# Patient Record
Sex: Female | Born: 1975 | Hispanic: Yes | Marital: Single | Smoking: Former smoker
Health system: Southern US, Community
[De-identification: ages and names within clinical notes are randomized; demographics above are authoritative.]

## PROBLEM LIST (undated history)

## (undated) DIAGNOSIS — J45909 Unspecified asthma, uncomplicated: Secondary | ICD-10-CM

## (undated) DIAGNOSIS — M549 Dorsalgia, unspecified: Secondary | ICD-10-CM

## (undated) HISTORY — PX: CHOLECYSTECTOMY: SHX55

---

## 2005-01-07 ENCOUNTER — Emergency Department (HOSPITAL_COMMUNITY): Admission: EM | Admit: 2005-01-07 | Discharge: 2005-01-07 | Payer: Self-pay | Admitting: Emergency Medicine

## 2005-10-23 ENCOUNTER — Emergency Department (HOSPITAL_COMMUNITY): Admission: EM | Admit: 2005-10-23 | Discharge: 2005-10-23 | Payer: Self-pay | Admitting: Emergency Medicine

## 2006-11-04 ENCOUNTER — Emergency Department (HOSPITAL_COMMUNITY): Admission: EM | Admit: 2006-11-04 | Discharge: 2006-11-04 | Payer: Self-pay | Admitting: Emergency Medicine

## 2007-08-14 ENCOUNTER — Ambulatory Visit (HOSPITAL_COMMUNITY): Admission: RE | Admit: 2007-08-14 | Discharge: 2007-08-14 | Payer: Self-pay | Admitting: Obstetrics

## 2015-08-13 ENCOUNTER — Emergency Department (HOSPITAL_COMMUNITY)
Admission: EM | Admit: 2015-08-13 | Discharge: 2015-08-13 | Disposition: A | Discharge status: Home / Self Care | Payer: Self-pay | Attending: Emergency Medicine | Admitting: Emergency Medicine

## 2015-08-13 ENCOUNTER — Encounter (HOSPITAL_COMMUNITY): Payer: Self-pay | Admitting: Emergency Medicine

## 2015-08-13 DIAGNOSIS — G8929 Other chronic pain: Secondary | ICD-10-CM | POA: Insufficient documentation

## 2015-08-13 DIAGNOSIS — M5442 Lumbago with sciatica, left side: Secondary | ICD-10-CM | POA: Insufficient documentation

## 2015-08-13 HISTORY — DX: Dorsalgia, unspecified: M54.9

## 2015-08-13 MED ORDER — HYDROCODONE-ACETAMINOPHEN 5-325 MG PO TABS
2.0000 | ORAL_TABLET | Freq: Once | ORAL | Status: AC
  Administered 2015-08-13: 2 via ORAL
  Filled 2015-08-13: qty 2

## 2015-08-13 NOTE — Discharge Instructions (Signed)
Chronic Back Pain  When back pain lasts longer than 3 months, it is called chronic back pain.People with chronic back pain often go through certain periods that are more intense (flare-ups).  CAUSES Chronic back pain can be caused by wear and tear (degeneration) on different structures in your back. These structures include:  The bones of your spine (vertebrae) and the joints surrounding your spinal cord and nerve roots (facets).  The strong, fibrous tissues that connect your vertebrae (ligaments). Degeneration of these structures may result in pressure on your nerves. This can lead to constant pain. HOME CARE INSTRUCTIONS  Avoid bending, heavy lifting, prolonged sitting, and activities which make the problem worse.  Take brief periods of rest throughout the day to reduce your pain. Lying down or standing usually is better than sitting while you are resting.  Take over-the-counter or prescription medicines only as directed by your caregiver. SEEK IMMEDIATE MEDICAL CARE IF:   You have weakness or numbness in one of your legs or feet.  You have trouble controlling your bladder or bowels.  You have nausea, vomiting, abdominal pain, shortness of breath, or fainting. Document Released: 12/14/2004 Document Revised: 01/29/2012 Document Reviewed: 10/21/2011 [facility] Patient Information 2015 [place], [place]. This information is not intended to replace advice given to you by your health care provider. Make sure you discuss any questions you have with your health care provider.  Back Pain, Adult Back pain is very common. The pain often gets better over time. The cause of back pain is usually not dangerous. Most people can learn to manage their back pain on their own.  HOME CARE   Stay active. Start with short walks on flat ground if you can. Try to walk farther each day.  Do not sit, drive, or stand in one place for more than 30 minutes. Do not stay in bed.  Do not avoid exercise or work.  Activity can help your back heal faster.  Be careful when you bend or lift an object. Bend at your knees, keep the object close to you, and do not twist.  Sleep on a firm mattress. Lie on your side, and bend your knees. If you lie on your back, put a pillow under your knees.  Only take medicines as told by your doctor.  Put ice on the injured area.  Put ice in a plastic bag.  Place a towel between your skin and the bag.  Leave the ice on for 15-20 minutes, 03-04 times a day for the first 2 to 3 days. After that, you can switch between ice and heat packs.  Ask your doctor about back exercises or massage.  Avoid feeling anxious or stressed. Find good ways to deal with stress, such as exercise. GET HELP RIGHT AWAY IF:   Your pain does not go away with rest or medicine.  Your pain does not go away in 1 week.  You have new problems.  You do not feel well.  The pain spreads into your legs.  You cannot control when you poop (bowel movement) or pee (urinate).  Your arms or legs feel weak or lose feeling (numbness).  You feel sick to your stomach (nauseous) or throw up (vomit).  You have belly (abdominal) pain.  You feel like you may pass out (faint). MAKE SURE YOU:   Understand these instructions.  Will watch your condition.  Will get help right away if you are not doing well or get worse. Document Released: 04/24/2008 Document Revised: 01/29/2012 Document  Reviewed: 03/10/2014 ExitCare Patient Information 2015 [place], [place]. This information is not intended to replace advice given to you by your health care provider. Make sure you discuss any questions you have with your health care provider.  Back Exercises Back exercises help treat and prevent back injuries. The goal of back exercises is to increase the strength of your abdominal and back muscles and the flexibility of your back. These exercises should be started when you no longer have back pain. Back exercises  include:  Pelvic Tilt. Lie on your back with your knees bent. Tilt your pelvis until the lower part of your back is against the floor. Hold this position 5 to 10 sec and repeat 5 to 10 times.  Knee to Chest. Pull first 1 knee up against your chest and hold for 20 to 30 seconds, repeat this with the other knee, and then both knees. This may be done with the other leg straight or bent, whichever feels better.  Sit-Ups or Curl-Ups. Bend your knees 90 degrees. Start with tilting your pelvis, and do a partial, slow sit-up, lifting your trunk only 30 to 45 degrees off the floor. Take at least 2 to 3 seconds for each sit-up. Do not do sit-ups with your knees out straight. If partial sit-ups are difficult, simply do the above but with only tightening your abdominal muscles and holding it as directed.  Hip-Lift. Lie on your back with your knees flexed 90 degrees. Push down with your feet and shoulders as you raise your hips a couple inches off the floor; hold for 10 seconds, repeat 5 to 10 times.  Back arches. Lie on your stomach, propping yourself up on bent elbows. Slowly press on your hands, causing an arch in your low back. Repeat 3 to 5 times. Any initial stiffness and discomfort should lessen with repetition over time.  Shoulder-Lifts. Lie face down with arms beside your body. Keep hips and torso pressed to floor as you slowly lift your head and shoulders off the floor. Do not overdo your exercises, especially in the beginning. Exercises may cause you some mild back discomfort which lasts for a few minutes; however, if the pain is more severe, or lasts for more than 15 minutes, do not continue exercises until you see your caregiver. Improvement with exercise therapy for back problems is slow.  See your caregivers for assistance with developing a proper back exercise program. Document Released: 12/14/2004 Document Revised: 01/29/2012 Document Reviewed: 09/07/2011 [facility] Patient Information 2015  [place], [place]. This information is not intended to replace advice given to you by your health care provider. Make sure you discuss any questions you have with your health care provider.

## 2015-08-13 NOTE — ED Provider Notes (Signed)
CSN: [id number]     Arrival date & time 08/13/15  8295 History   First MD Initiated Contact with Patient 08/13/15 [phone]     Chief Complaint  Patient presents with  . Back Pain   HPI  Ms. Bachtell is a 39 year old female with PMHx of back pain presenting with acute worsening of her chronic pain. Pt states she was diagnosed with a "pinched nerve" 7 years ago. She states that she has had multiple exacerbations of her back pain over the past few years that are usually well controlled with tylenol or aspirin. Pt states that her current episode started 3 days ago and is the same presentation of back pain as previous episodes but is more severe. Pain is in her lumbar back and radiates down into the left buttock and posterior left thigh and stops at the knee. The pain is sharp and shooting. Pain aggravated by movement though patient is still able to ambulate. She has tried tylenol today without relief. Denies fevers, chills, chest pain, SOB, abdominal pain, nausea, vomiting, incontinence of bowel or bladder, numbness to groin or legs or weakness of the legs. Denies IVDU or history of cancer. Pt moved to [place] 2 weeks ago and has not established care with a local PCP yet.   Past Medical History  Diagnosis Date  . Back pain    Past Surgical History  Procedure Laterality Date  . Cholecystectomy     No family history on file. Social History  Substance Use Topics  . Smoking status: Never Smoker   . Smokeless tobacco: None  . Alcohol Use: No   OB History    No data available     Review of Systems  Constitutional: Negative for fever and chills.  Respiratory: Negative for shortness of breath.   Cardiovascular: Negative for chest pain.  Gastrointestinal: Negative for nausea, vomiting and abdominal pain.  Genitourinary: Negative for difficulty urinating.  Musculoskeletal: Positive for myalgias and back pain. Negative for gait problem.  Skin: Negative for wound.  Neurological: Negative for  weakness, numbness and headaches.      Allergies  Morphine and related and Motrin  Home Medications   Prior to Admission medications   Medication Sig Start Date End Date Taking? Authorizing Provider  aspirin-acetaminophen-caffeine (EXCEDRIN EXTRA STRENGTH) [phone] MG per tablet Take 3 tablets by mouth every 6 (six) hours as needed (back pain).   Yes Historical Provider, MD   BP 105/67 mmHg  Pulse 68  Temp(Src) 97.8 F (36.6 C) (Oral)  Resp 18  SpO2 100%  LMP 07/30/2015 Physical Exam  Constitutional: She appears well-developed and well-nourished. No distress.  HENT:  Head: Normocephalic and atraumatic.  Eyes: Conjunctivae are normal. Right eye exhibits no discharge. Left eye exhibits no discharge. No scleral icterus.  Neck: Normal range of motion.  Cardiovascular: Normal rate.   Pedal pulses palpable Cap refill < 3  Pulmonary/Chest: Effort normal. No respiratory distress.  Musculoskeletal: Normal range of motion.  FROM of left hip and lumbar back intact though with moderate pain. TTP generalized over lumbar spine. Pt walks steadily while holding the shoulder of her sister.   Neurological: She is alert. Coordination normal.  5/5 strength of left ankle. 4/5 strength of left hip secondary to pain on testing. Sensation to light touch intact.  Skin: Skin is warm and dry.  Psychiatric: She has a normal mood and affect. Her behavior is normal.  Nursing note and vitals reviewed.   ED Course  Procedures (including critical care  time) Labs Review Labs Reviewed - No data to display  Imaging Review No results found. I have personally reviewed and evaluated these images and lab results as part of my medical decision-making.   EKG Interpretation None      MDM   Final diagnoses:  Left-sided low back pain with left-sided sciatica   Pt with acute worsening of chronic pain. Pain controlled with vicodin in ED. Pt states she is now ready to go home. Referral to community  health and wellness given in discharge paperwork so that she may establish care for her chronic back pain. Return precautions given in discharge paperwork and discussed with pt at bedside. Pt stable for discharge     Alveta Heimlich, PA-C 08/13/15 1523  Laurence Spates, MD 08/14/15 [phone]

## 2015-08-13 NOTE — ED Notes (Signed)
Per pt, states she has a pinched nerve causing lower back pain-started 2 days ago

## 2015-08-13 NOTE — ED Notes (Signed)
Ambulated to bathroom with assistance  

## 2015-08-13 NOTE — ED Notes (Signed)
ED PA at bedside

## 2015-12-12 ENCOUNTER — Encounter (HOSPITAL_COMMUNITY): Payer: Self-pay | Admitting: Emergency Medicine

## 2015-12-12 ENCOUNTER — Emergency Department (HOSPITAL_COMMUNITY)
Admission: EM | Admit: 2015-12-12 | Discharge: 2015-12-12 | Disposition: A | Discharge status: Home / Self Care | Payer: Medicaid Other | Attending: Emergency Medicine | Admitting: Emergency Medicine

## 2015-12-12 DIAGNOSIS — R197 Diarrhea, unspecified: Secondary | ICD-10-CM | POA: Insufficient documentation

## 2015-12-12 DIAGNOSIS — R110 Nausea: Secondary | ICD-10-CM | POA: Insufficient documentation

## 2015-12-12 DIAGNOSIS — H6692 Otitis media, unspecified, left ear: Secondary | ICD-10-CM | POA: Diagnosis not present

## 2015-12-12 DIAGNOSIS — J069 Acute upper respiratory infection, unspecified: Secondary | ICD-10-CM | POA: Diagnosis not present

## 2015-12-12 DIAGNOSIS — R05 Cough: Secondary | ICD-10-CM | POA: Diagnosis present

## 2015-12-12 MED ORDER — BENZONATATE 100 MG PO CAPS: 200.0000 mg | ORAL_CAPSULE | Freq: Two times a day (BID) | ORAL | Status: DC | PRN

## 2015-12-12 MED ORDER — OXYMETAZOLINE HCL 0.05 % NA SOLN
1.0000 | Freq: Two times a day (BID) | NASAL | Status: DC
Start: 2015-12-12 — End: 2016-03-15

## 2015-12-12 MED ORDER — AMOXICILLIN 500 MG PO CAPS: 500.0000 mg | ORAL_CAPSULE | Freq: Two times a day (BID) | ORAL | Status: DC

## 2015-12-12 NOTE — Discharge Instructions (Signed)
Take your medications as prescribed. I recommend continuing to drink fluids to remain hydrated. You may also use a cold mist humidifier for symptomatic relief. Please follow up with a primary care provider from the Resource Guide provided below in 3-4 days. Please return to the Emergency Department if symptoms worsen or new onset of fever, coughing up blood, chest pain, difficulty breathing, vomiting, vomiting blood, lightheadedness, dizziness, syncope, seizure.   Emergency Department Resource Guide 1) Find a Doctor and Pay Out of Pocket Although you won't have to find out who is covered by your insurance plan, it is a good idea to ask around and get recommendations. You will then need to call the office and see if the doctor you have chosen will accept you as a new patient and what types of options they offer for patients who are self-pay. Some doctors offer discounts or will set up payment plans for their patients who do not have insurance, but you will need to ask so you aren't surprised when you get to your appointment.  2) Contact Your Local Health Department Not all health departments have doctors that can see patients for sick visits, but many do, so it is worth a call to see if yours does. If you don't know where your local health department is, you can check in your phone book. The CDC also has a tool to help you locate your state's health department, and many state websites also have listings of all of their local health departments.  3) Find a Walk-in Clinic If your illness is not likely to be very severe or complicated, you may want to try a walk in clinic. These are popping up all over the country in pharmacies, drugstores, and shopping centers. They're usually staffed by nurse practitioners or physician assistants that have been trained to treat common illnesses and complaints. They're usually fairly quick and inexpensive. However, if you have serious medical issues or chronic medical  problems, these are probably not your best option.  No Primary Care Doctor: - Call Health Connect at  [phone] - they can help you locate a primary care doctor that  accepts your insurance, provides certain services, etc. - Physician Referral Service- [phone]  Chronic Pain Problems: Organization         Address  Phone   Notes  [facility]  [phone] Patients need to be referred by their primary care doctor.   Medication Assistance: Organization         Address  Phone   Notes  [facility] Medication [facility] [address] [place] [address] [place], [place] [address] [phone] --Must be a resident of [facility] -- Must have NO insurance coverage whatsoever (no Medicaid/ Medicare, etc.) -- The pt. MUST have a primary care doctor that directs their care regularly and follows them in the community   [facility]  [phone]   [employer]  [phone]    Agencies that provide inexpensive medical care: Organization         Address  Phone   Notes  [facility]  [phone]   [facility]    [phone]   [facility] [address] [place], [place] [address] [phone]   [facility] [address] [place], [place] [phone]   Planned Parenthood    [phone]   [facility]    [phone]   [facility]  [facility]  [address], [place] Phone:  [phone], Fax:  [phone] Hours of Operation:  9 am - 6 pm, M-F.  Also accepts Medicaid/Medicare and self-pay.  [facility]  [address], [place] Phone: [phone], Fax: [phone]. Hours of Operation:  8:30 am - 5:30 pm, M-F.  Also accepts Medicaid and self-pay.  [facility] [place] [address], [place] Phone: [phone]   [facility] [address] Natasha Bence [place], [place] [phone], Ext. [phone]  Mondays & Thursdays: 7-9 AM.  First 15 patients are seen on a first come, first serve basis.    Medicaid-accepting [facility] Providers:  Organization         Address  Phone   Notes  [facility] [address], [place] [phone] Also accepts self-pay patients.  [facility] [address] Laurell Josephs [place], [place]  [phone]   [facility] [address], [place] [phone]   [facility] [address] [place] [phone]   Renaye Rakers [address], [place]   [phone] Only accepts [place] Access [place] patients after they have their name applied to their card.   Self-Pay (no insurance) in [facility]:  Organization         Address  Phone   Notes  Sickle Cell Patients, [facility] Internal Medicine [address] [place], [place] [phone]   [facility] [address] [place], [place] [phone]   [facility] [place]  [address], [place] [phone]   [facility]/Dr. Osei-Bonsu  [address] [place] or [address], [place] [phone] Phone number for both [place] and [place] locations is the same.  Urgent Medical and [facility] [address] [place] [phone]   [facility] [address], [place] or [address] [phone]   [facility] [address], [place] [phone], phone; [phone], fax Sees patients 1st and 3rd Saturday of every month.  Must not qualify for public or private insurance (i.e. Medicaid, Medicare, [place] Health Choice, Veterans' Benefits)  Household income should be no more than 200% of the poverty level The clinic cannot treat you if you are pregnant or think you are pregnant  Sexually transmitted diseases are not treated at  the clinic.    Dental Care: Organization         Address  Phone  Notes  [facility] Department of [facility] [address] [place], [place] [phone] Accepts children up to age 76 who are enrolled in [place] or [place] Health Choice; pregnant women with a Medicaid card; and children who have applied for Medicaid or [place] Health Choice, but were declined, whose parents can pay a reduced fee at time of service.  [facility] Department of [facility]  [address], [place] [phone] Accepts children up to age 22 who are enrolled in [place] or [place] Health Choice; pregnant women with a Medicaid card; and children who have applied for Medicaid or [place] Health Choice, but were declined, whose parents can pay a reduced fee at time of service.  [facility]  [address] [place], [place] (  [phone] Patients are seen by appointment only. Walk-ins are not accepted. [facility] will see patients 58 years of age and older. Monday - Tuesday (8am-5pm) Most Wednesdays (8:30-5pm) $30 per visit, cash only  [facility] Dental Access PRO[facility]  [address], [facility] [phone] Patients are seen by appointment only. Walk-ins are not accepted. [facility] will see patients 48 years of age and older. One Wednesday Evening (Monthly: Volunteer Based).  $30 per visit, cash only  [employer]  [phone] for adults; Children under age 24, call Graduate Pediatric Dentistry at [phone]. Children aged 24-14, please call [phone] to request a pediatric application.  Dental services are provided in all areas of dental care including fillings, crowns and bridges, complete and partial dentures, implants, gum treatment, root canals, and extractions. Preventive care is also provided. Treatment is provided to both adults and children. Patients are selected via a lottery and there is often a  waiting list.   [facility] [address] [place]  [phone] [email or web address]   [facility] [address] [place], [place] [phone], Ext. [phone] Second and Fourth Thursday of each month, opens at 6:30 AM; Clinic ends at 9 AM.  Patients are seen on a first-come first-served basis, and a limited number are seen during each clinic.   [facility]  [address] Ether Griffins [place], [place] [phone]   Eligibility Requirements You must have lived in [place], [place], or [place] counties for at least the last three months.   You cannot be eligible for state or federal sponsored [place], including [employer], [place], or [employer].   You generally cannot be eligible for healthcare insurance through your employer.    How to apply: Eligibility screenings are held every Tuesday and Wednesday afternoon from 1:00 pm until 4:00 pm. You do not need an appointment for the interview!  [facility] [address], [place], [place] [phone]   [facility] Health Department  [phone]   [facility] Health [facility]  [phone]   [facility] Health Department  [phone]    Behavioral Health Resources in the Community: Intensive Outpatient Programs Organization         Address  Phone  Notes  [facility] [address] [place], [place] [phone]   [facility] Outpatient [address] [place], [place] [phone]   [facility] [address], [place], [place]  [phone]   [facility] [address],  [place], [place] [phone] or [phone]   Substance Abuse Resources Organization         Address  Phone  Notes  [facility]  [phone]   [facility]  [phone]   [facility] [place]  [phone]   Floydene Flock  [phone]   Residential & Outpatient Substance Abuse  Program  [phone]   Psychological Services Organization         Address  Phone  Notes  [facility]  [phone]   [facility]  [phone]   [facility] Mental Health [address], [place] [phone] or [phone]    Mobile Crisis Teams Organization         Address  Phone  Notes  [facility], Mobile Crisis Care Unit  [phone]   [facility]  [address] [place], [place] [phone]   Doristine Locks [address] [place] [phone]    Self-Help/Support Groups Organization         Address  Phone             Notes  [facility] [place] - variety of support groups  336- I7437963 Call for more information  Narcotics Anonymous (NA), [facility] [address], [employer] [place]  2 meetings at this location   Statistician         Address  Phone  Notes  [facility] [address],    [place]  [phone]   [facility]  [address] [place] [address], [place], [place] [phone]   [facility] [address] [place], [place] [phone] Admissions: 8am-3pm M-F  [facility] [address],    [place], [place] [phone]   [facility] [address] [place], [place], [place] [phone]   [facility] [address]  [place], [place] [phone]   [facility] - Intensive Outpatient [address] Laurell Josephs [address], [place], [place] [phone]   [facility] ([facility] [address]  [place], [place] [phone] or [phone]   [facility] ([facility]) [address] [place], [place] [phone] Accepts Medicaid  [facility] [address]  [place] [phone] Substance Abuse/Addiction Treatment   [facility] Organization          Address  Phone  Notes  [facility]  [phone]   Angie Fava, PhD [address] Ervin Knack [place], [place]   [phone] or [phone]   [facility]   [address] [place], [place] [phone]   [facility] [address], [place], [place] [phone] Insurance/Medicaid/sponsorship through [facility] [address]                                    [place], [place] [phone] Therapy/tele-psych/case  [facility] [address], [place] [phone]    Dr. Lolly Mustache  [phone]   [facility]  [employer] [facility] Dept. 1) [address], [place] 2) [address], [place] 3)  [address], [place] [phone]  [phone]   [facility] Child Abuse Hotline 434-057-6585 or [phone] (After Hours)

## 2015-12-12 NOTE — ED Provider Notes (Signed)
CSN: [id number]     Arrival date & time 12/12/15  1045 History  By signing my name below, I, Linus Galas, attest that this documentation has been prepared under the direction and in the presence of Melburn Hake, PA-C. Electronically Signed: Linus Galas, ED Scribe. 12/12/2015. 12:41 PM.    Chief Complaint  Patient presents with  . Cough  . Headache  . Otalgia   The history is provided by the patient. No language interpreter was used.   HPI Comments: Kiara Leach is a 40 y.o. female who presents to the Emergency Department complaining of a constant, unchanged productive cough (yellow sputum), onset 3 weeks Pt reports having a single episode of productive cough with a "streak of blood,". Endorses chest pain/tightness when coughing, SOB, and wheezing for the past one week. Pt also reports nasal congestion, sinus pressure, left otalgia, nausea, nonbloody diarrhea, and a resolved sore throat. Pt has been taking Tylenol Cold and Flu and "cough syrup" with mild relief. Pt has been exposed to sick contacts with similar sx. Pt denies fever, blood in stool, and vomiting, urinary sxs, abdominal pain. Pt reports being allergic to Motrin and Morphine.   Past Medical History  Diagnosis Date  . Back pain    Past Surgical History  Procedure Laterality Date  . Cholecystectomy     No family history on file. Social History  Substance Use Topics  . Smoking status: Never Smoker   . Smokeless tobacco: None  . Alcohol Use: No   OB History    No data available     Review of Systems  Constitutional: Negative for fever.  HENT: Positive for ear pain, sinus pressure and sore throat.        + nasal congestion  Respiratory: Positive for cough, chest tightness, shortness of breath and wheezing.   Gastrointestinal: Positive for nausea and diarrhea. Negative for vomiting and blood in stool.      Allergies  Morphine and related and Motrin  Home Medications   Prior to Admission medications    Medication Sig Start Date End Date Taking? Authorizing Provider  amoxicillin (AMOXIL) 500 MG capsule Take 1 capsule (500 mg total) by mouth 2 (two) times daily. 12/12/15   Barrett Henle, PA-C  aspirin-acetaminophen-caffeine (EXCEDRIN EXTRA STRENGTH) [phone] MG per tablet Take 3 tablets by mouth every 6 (six) hours as needed (back pain).    Historical Provider, MD  benzonatate (TESSALON) 100 MG capsule Take 2 capsules (200 mg total) by mouth 2 (two) times daily as needed for cough. 12/12/15   Barrett Henle, PA-C  oxymetazoline (AFRIN NASAL SPRAY) 0.05 % nasal spray Place 1 spray into both nostrils 2 (two) times daily. 12/12/15   Satira Sark Nadeau, PA-C   BP 112/59 mmHg  Pulse 78  Temp(Src) 98.2 F (36.8 C) (Oral)  Resp 22  SpO2 100%  LMP 11/27/2015 Physical Exam  Constitutional: She is oriented to person, place, and time. She appears well-developed and well-nourished.  HENT:  Head: Normocephalic and atraumatic.  Right Ear: Tympanic membrane normal.  Left Ear: Tympanic membrane is erythematous.  Nose: Rhinorrhea present. Right sinus exhibits maxillary sinus tenderness and frontal sinus tenderness. Left sinus exhibits maxillary sinus tenderness and frontal sinus tenderness.  Mouth/Throat: Uvula is midline, oropharynx is clear and moist and mucous membranes are normal.  Eyes: Conjunctivae and EOM are normal. Right eye exhibits no discharge. Left eye exhibits no discharge. No scleral icterus.  Neck: Normal range of motion. Neck supple.  Cardiovascular: Normal rate, regular  rhythm, normal heart sounds and intact distal pulses.   Pulmonary/Chest: Effort normal and breath sounds normal. No respiratory distress. She has no wheezes. She has no rales. She exhibits no tenderness.  Abdominal: Soft. Bowel sounds are normal. She exhibits no distension and no mass. There is no tenderness. There is no rebound and no guarding.  Musculoskeletal: Normal range of motion. She exhibits  no edema.  Lymphadenopathy:    She has no cervical adenopathy.  Neurological: She is alert and oriented to person, place, and time.  Skin: Skin is warm and dry.  Psychiatric: She has a normal mood and affect.  Nursing note and vitals reviewed.   ED Course  Procedures  DIAGNOSTIC STUDIES: Oxygen Saturation is 100% on room air, normal by my interpretation.    COORDINATION OF CARE: 12:05 PM Discussed treatment plan with pt at bedside and pt agreed to plan.   Labs Review Labs Reviewed - No data to display  Imaging Review No results found. I have personally reviewed and evaluated these images and lab results as part of my medical decision-making.  Filed Vitals:   12/12/15 1139  BP: 112/59  Pulse: 78  Temp: 98.2 F (36.8 C)  Resp: 22     MDM   Final diagnoses:  URI, acute  Acute left otitis media, recurrence not specified, unspecified otitis media type    Patient presents with URI symptoms for the past 3 weeks. No relief with over-the-counter medications. Endorses sick contacts at home with similar symptoms. VSS. Exam revealed bilateral maxillary and frontal sinus tenderness, left TM erythematous, rhinorrhea present, lungs CTAB, no chest wall tenderness. I suspect patient likely has a upper respiratory infection and left otitis media. Will plan to discharge patient home with amoxicillin, antitussive and decongestion. Discussed symptomatic treatment. Patient given resource guide to follow up with PCP.  Evaluation does not show pathology requring ongoing emergent intervention or admission. Pt is hemodynamically stable and mentating appropriately. Discussed findings/results and plan with patient/guardian, who agrees with plan. All questions answered. Return precautions discussed and outpatient follow up given.    I personally performed the services described in this documentation, which was scribed in my presence. The recorded information has been reviewed and is accurate.     Satira Sark Nunn, [place] 12/12/15 1247  Doug Sou, MD 12/12/15 [phone]

## 2015-12-12 NOTE — ED Notes (Signed)
Declined W/C at D/C and was escorted to lobby by RN. 

## 2015-12-12 NOTE — ED Notes (Signed)
Pt c/o cough and cold symptoms x 3 weeks with headache when she coughs. Pt also c/o left ear pain.

## 2016-03-15 ENCOUNTER — Inpatient Hospital Stay (HOSPITAL_COMMUNITY)
Admission: EM | Admit: 2016-03-15 | Discharge: 2016-03-23 | DRG: 194 | Disposition: A | Discharge status: Home / Self Care | Payer: Medicaid Other | Attending: Internal Medicine | Admitting: Internal Medicine

## 2016-03-15 ENCOUNTER — Emergency Department (HOSPITAL_COMMUNITY): Payer: Medicaid Other

## 2016-03-15 ENCOUNTER — Encounter (HOSPITAL_COMMUNITY): Payer: Self-pay | Admitting: Emergency Medicine

## 2016-03-15 DIAGNOSIS — J189 Pneumonia, unspecified organism: Principal | ICD-10-CM | POA: Diagnosis present

## 2016-03-15 DIAGNOSIS — R062 Wheezing: Secondary | ICD-10-CM

## 2016-03-15 DIAGNOSIS — R0602 Shortness of breath: Secondary | ICD-10-CM

## 2016-03-15 DIAGNOSIS — Z885 Allergy status to narcotic agent status: Secondary | ICD-10-CM

## 2016-03-15 DIAGNOSIS — Z6841 Body Mass Index (BMI) 40.0 and over, adult: Secondary | ICD-10-CM

## 2016-03-15 DIAGNOSIS — R053 Chronic cough: Secondary | ICD-10-CM

## 2016-03-15 DIAGNOSIS — Z87891 Personal history of nicotine dependence: Secondary | ICD-10-CM

## 2016-03-15 DIAGNOSIS — R000 Tachycardia, unspecified: Secondary | ICD-10-CM

## 2016-03-15 DIAGNOSIS — R05 Cough: Secondary | ICD-10-CM

## 2016-03-15 DIAGNOSIS — R0989 Other specified symptoms and signs involving the circulatory and respiratory systems: Secondary | ICD-10-CM | POA: Insufficient documentation

## 2016-03-15 DIAGNOSIS — K529 Noninfective gastroenteritis and colitis, unspecified: Secondary | ICD-10-CM | POA: Diagnosis present

## 2016-03-15 DIAGNOSIS — R112 Nausea with vomiting, unspecified: Secondary | ICD-10-CM | POA: Diagnosis present

## 2016-03-15 DIAGNOSIS — A419 Sepsis, unspecified organism: Secondary | ICD-10-CM | POA: Diagnosis present

## 2016-03-15 DIAGNOSIS — Z886 Allergy status to analgesic agent status: Secondary | ICD-10-CM

## 2016-03-15 LAB — URINALYSIS, ROUTINE W REFLEX MICROSCOPIC
Bilirubin Urine: NEGATIVE
GLUCOSE, UA: NEGATIVE mg/dL
Ketones, ur: 15 mg/dL — AB
LEUKOCYTES UA: NEGATIVE
Nitrite: NEGATIVE
PH: 6.5 (ref 5.0–8.0)
Protein, ur: NEGATIVE mg/dL
SPECIFIC GRAVITY, URINE: 1.013 (ref 1.005–1.030)

## 2016-03-15 LAB — COMPREHENSIVE METABOLIC PANEL
ALT: 32 U/L (ref 14–54)
AST: 39 U/L (ref 15–41)
Albumin: 3.2 g/dL — ABNORMAL LOW (ref 3.5–5.0)
Alkaline Phosphatase: 110 U/L (ref 38–126)
Anion gap: 12 (ref 5–15)
CHLORIDE: 105 mmol/L (ref 101–111)
CO2: 21 mmol/L — AB (ref 22–32)
CREATININE: 0.84 mg/dL (ref 0.44–1.00)
Calcium: 9.3 mg/dL (ref 8.9–10.3)
Glucose, Bld: 138 mg/dL — ABNORMAL HIGH (ref 65–99)
POTASSIUM: 4 mmol/L (ref 3.5–5.1)
SODIUM: 138 mmol/L (ref 135–145)
Total Bilirubin: 0.5 mg/dL (ref 0.3–1.2)
Total Protein: 8 g/dL (ref 6.5–8.1)

## 2016-03-15 LAB — CBC
HEMATOCRIT: 40.9 % (ref 36.0–46.0)
Hemoglobin: 13.8 g/dL (ref 12.0–15.0)
MCH: 24.2 pg — AB (ref 26.0–34.0)
MCHC: 33.7 g/dL (ref 30.0–36.0)
MCV: 71.8 fL — AB (ref 78.0–100.0)
Platelets: 387 10*3/uL (ref 150–400)
RBC: 5.7 MIL/uL — AB (ref 3.87–5.11)
RDW: 15.5 % (ref 11.5–15.5)
WBC: 17.9 10*3/uL — AB (ref 4.0–10.5)

## 2016-03-15 LAB — URINE MICROSCOPIC-ADD ON

## 2016-03-15 LAB — POC URINE PREG, ED: Preg Test, Ur: NEGATIVE

## 2016-03-15 MED ORDER — ALBUTEROL SULFATE (2.5 MG/3ML) 0.083% IN NEBU
5.0000 mg | INHALATION_SOLUTION | Freq: Once | RESPIRATORY_TRACT | Status: AC
  Administered 2016-03-15: 5 mg via RESPIRATORY_TRACT
  Filled 2016-03-15: qty 6

## 2016-03-15 MED ORDER — SODIUM CHLORIDE 0.9 % IV BOLUS (SEPSIS)
1000.0000 mL | Freq: Once | INTRAVENOUS | Status: AC
Start: 2016-03-15 — End: 2016-03-16
  Administered 2016-03-15: 1000 mL via INTRAVENOUS

## 2016-03-15 MED ORDER — IPRATROPIUM BROMIDE 0.02 % IN SOLN
0.5000 mg | Freq: Once | RESPIRATORY_TRACT | Status: AC
  Administered 2016-03-15: 0.5 mg via RESPIRATORY_TRACT
  Filled 2016-03-15: qty 2.5

## 2016-03-15 MED ORDER — GUAIFENESIN-CODEINE 100-10 MG/5ML PO SOLN
10.0000 mL | Freq: Once | ORAL | Status: AC
  Administered 2016-03-15: 10 mL via ORAL
  Filled 2016-03-15: qty 10

## 2016-03-15 MED ORDER — ALBUTEROL SULFATE (2.5 MG/3ML) 0.083% IN NEBU
5.0000 mg | INHALATION_SOLUTION | Freq: Once | RESPIRATORY_TRACT | Status: AC
  Administered 2016-03-15: 5 mg via RESPIRATORY_TRACT

## 2016-03-15 MED ORDER — AZITHROMYCIN 250 MG PO TABS
500.0000 mg | ORAL_TABLET | Freq: Once | ORAL | Status: AC
  Administered 2016-03-15: 500 mg via ORAL
  Filled 2016-03-15: qty 2

## 2016-03-15 MED ORDER — ALBUTEROL SULFATE (2.5 MG/3ML) 0.083% IN NEBU
INHALATION_SOLUTION | RESPIRATORY_TRACT | Status: AC
  Filled 2016-03-15: qty 6

## 2016-03-15 NOTE — ED Notes (Addendum)
Screened pt for possible fast track patient.  Pt also reports dizziness, nausea, vomiting, and diarrhea x 2 weeks.  Pt denies any improvement with breathing treatment at triage.

## 2016-03-15 NOTE — ED Notes (Signed)
Pt. reports persistent productive cough with wheezing and chest congestion for several days unrelieved by OTC medications , denies fever or chills, respirations unlabored.

## 2016-03-15 NOTE — ED Provider Notes (Addendum)
CSN: [id number]     Arrival date & time 03/15/16  1940 History   First MD Initiated Contact with Patient 03/15/16 2006     Chief Complaint  Patient presents with  . Cough  . Wheezing  . Emesis  . Diarrhea     (Consider location/radiation/quality/duration/timing/severity/associated sxs/prior Treatment) HPI  Patient to the ER for evaluation of cough, wheezing, chest congestion, chest pain, fatigue, nausea, headache, upper back pain, vomiting and diarrhea for two weeks. She is typically healthy with PMH of back pain and cholecystectomy.  She states that this is her first time being seen for her symptoms. She has taken cough and cold medications at home without any relief and is now getting worse. She describes her cough as severe and the most bothersome symptom. Denies fevers, chills, LE swelling, rash, focal weakness, change in vision aphagia, abdominal pain, neck pain.  PCP: No primary care provider on file. Kiara Leach is a 40 y.o.  female   Past Medical History  Diagnosis Date  . Back pain    Past Surgical History  Procedure Laterality Date  . Cholecystectomy     No family history on file. Social History  Substance Use Topics  . Smoking status: Never Smoker   . Smokeless tobacco: None  . Alcohol Use: No   OB History    No data available     Review of Systems  Review of Systems All other systems negative except as documented in the HPI. All pertinent positives and negatives as reviewed in the HPI.   Allergies  Morphine and related and Motrin  Home Medications   Prior to Admission medications   Medication Sig Start Date End Date Taking? Authorizing Provider  aspirin-acetaminophen-caffeine (EXCEDRIN EXTRA STRENGTH) [phone] MG per tablet Take 3 tablets by mouth every 6 (six) hours as needed (back pain).   Yes Historical Provider, MD  azithromycin (ZITHROMAX) 250 MG tablet Take 1 tablet (250 mg total) by mouth daily. Take first 2 tablets together, then 1 every  day until finished. 03/16/16   Skai Lickteig Neva Seat, PA-C  ondansetron (ZOFRAN) 4 MG tablet Take 1 tablet (4 mg total) by mouth every 6 (six) hours. 03/16/16   Chirsty Armistead Neva Seat, PA-C   BP 121/77 mmHg  Pulse 101  Temp(Src) 98.3 F (36.8 C) (Oral)  Resp 19  Ht  (1.6 m)  Wt 117.028 kg  BMI 45.71 kg/m2  SpO2 98%  LMP 02/20/2016 Physical Exam  Constitutional: She is oriented to person, place, and time. She appears well-developed and well-nourished. She appears ill. No distress.  HENT:  Head: Normocephalic and atraumatic.  Right Ear: Tympanic membrane and ear canal normal.  Left Ear: Tympanic membrane and ear canal normal.  Nose: Nose normal. No rhinorrhea.  Mouth/Throat: Uvula is midline, oropharynx is clear and moist and mucous membranes are normal.  Eyes: Pupils are equal, round, and reactive to light.  Neck: Normal range of motion. Neck supple. No spinous process tenderness and no muscular tenderness present.  Cardiovascular: Regular rhythm.  Tachycardia present.   Pulmonary/Chest: Effort normal. No accessory muscle usage. No tachypnea. No respiratory distress. She has no decreased breath sounds. She has wheezes. She has rhonchi in the left upper field, the left middle field and the left lower field. She has no rales. She exhibits no tenderness and no bony tenderness.  Severe cough with clear/white sputum  Abdominal: Soft.  No signs of abdominal distention  Musculoskeletal:  No LE swelling  Neurological: She is alert and oriented to person,  place, and time. She has normal strength.  Acting at baseline  Skin: Skin is warm and dry. No rash noted.  Nursing note and vitals reviewed.   ED Course  Procedures (including critical care time) Labs Review Labs Reviewed  COMPREHENSIVE METABOLIC PANEL - Abnormal; Notable for the following:    CO2 21 (*)    Glucose, Bld 138 (*)    BUN <5 (*)    Albumin 3.2 (*)    All other components within normal limits  CBC - Abnormal; Notable for the  following:    WBC 17.9 (*)    RBC 5.70 (*)    MCV 71.8 (*)    MCH 24.2 (*)    All other components within normal limits  URINALYSIS, ROUTINE W REFLEX MICROSCOPIC (NOT AT [facility]) - Abnormal; Notable for the following:    Hgb urine dipstick SMALL (*)    Ketones, ur 15 (*)    All other components within normal limits  URINE MICROSCOPIC-ADD ON - Abnormal; Notable for the following:    Squamous Epithelial / LPF 0-5 (*)    Bacteria, UA RARE (*)    All other components within normal limits  POC URINE PREG, ED    Imaging Review Dg Chest 2 View  03/15/2016  CLINICAL DATA:  Subacute onset of syncope, shortness of breath and chest pain. Congestion. Initial encounter. EXAM: CHEST  2 VIEW COMPARISON:  None. FINDINGS: The lungs are well-aerated. Left lingular opacity is compatible with pneumonia. There is no evidence of pleural effusion or pneumothorax. The heart is normal in size; the mediastinal contour is within normal limits. No acute osseous abnormalities are seen. IMPRESSION: Left lingular pneumonia noted. Followup PA and lateral chest X-ray is recommended in 3-4 weeks following trial of antibiotic therapy to ensure resolution and exclude underlying malignancy. Electronically Signed   By: Roanna RaiderJeffery  Chang M.D.   On: 03/15/2016 20:50   I have personally reviewed and evaluated these images and lab results as part of my medical decision-making.   EKG Interpretation None      MDM   Final diagnoses:  Community acquired pneumonia    Patient chest xray shows left lingular pneumonia. CBC shows elevated WBC at 17.9, CMP and UA are non acute. Neg pregnancy test.  Medications  sodium chloride 0.9 % bolus 1,000 mL (1,000 mLs Intravenous New Bag/Given 03/15/16 2306)  albuterol (PROVENTIL HFA;VENTOLIN HFA) 108 (90 Base) MCG/ACT inhaler 2 puff (not administered)  albuterol (PROVENTIL) (2.5 MG/3ML) 0.083% nebulizer solution 5 mg (5 mg Nebulization Given 03/15/16 1958)  albuterol (PROVENTIL) (2.5 MG/3ML)  0.083% nebulizer solution 5 mg (5 mg Nebulization Given 03/15/16 2303)  ipratropium (ATROVENT) nebulizer solution 0.5 mg (0.5 mg Nebulization Given 03/15/16 2303)  azithromycin (ZITHROMAX) tablet 500 mg (500 mg Oral Given 03/15/16 2303)  guaiFENesin-codeine 100-10 MG/5ML solution 10 mL (10 mLs Oral Given 03/15/16 2345)    The plan was for discharge but when the patient was ambulated with pulse ox she became tachypnic at 32, pulse increased to 120 and she became fatigued and short of breath with exacerbation of chest pain. She does not feel like she can go home. She does not have PCP, will call unassigned for admission.  [facility] inpatient, med-surg, Dr. Katrinka BlazingSmith, R.  Marlon Peliffany Jasmeen Fritsch, PA-C 03/16/16 0134  Nelva Nayobert Beaton, MD 03/16/16 [phone]

## 2016-03-16 ENCOUNTER — Encounter (HOSPITAL_COMMUNITY): Payer: Self-pay | Admitting: Internal Medicine

## 2016-03-16 DIAGNOSIS — R000 Tachycardia, unspecified: Secondary | ICD-10-CM

## 2016-03-16 DIAGNOSIS — Z87891 Personal history of nicotine dependence: Secondary | ICD-10-CM | POA: Diagnosis not present

## 2016-03-16 DIAGNOSIS — Z885 Allergy status to narcotic agent status: Secondary | ICD-10-CM | POA: Diagnosis not present

## 2016-03-16 DIAGNOSIS — J189 Pneumonia, unspecified organism: Secondary | ICD-10-CM | POA: Diagnosis present

## 2016-03-16 DIAGNOSIS — R112 Nausea with vomiting, unspecified: Secondary | ICD-10-CM | POA: Diagnosis present

## 2016-03-16 DIAGNOSIS — Z886 Allergy status to analgesic agent status: Secondary | ICD-10-CM | POA: Diagnosis not present

## 2016-03-16 DIAGNOSIS — Z6841 Body Mass Index (BMI) 40.0 and over, adult: Secondary | ICD-10-CM | POA: Diagnosis not present

## 2016-03-16 DIAGNOSIS — A419 Sepsis, unspecified organism: Secondary | ICD-10-CM | POA: Diagnosis present

## 2016-03-16 DIAGNOSIS — R0989 Other specified symptoms and signs involving the circulatory and respiratory systems: Secondary | ICD-10-CM | POA: Diagnosis not present

## 2016-03-16 DIAGNOSIS — R062 Wheezing: Secondary | ICD-10-CM | POA: Diagnosis not present

## 2016-03-16 DIAGNOSIS — K529 Noninfective gastroenteritis and colitis, unspecified: Secondary | ICD-10-CM | POA: Diagnosis present

## 2016-03-16 LAB — BASIC METABOLIC PANEL
Anion gap: 12 (ref 5–15)
BUN: <5 mg/dL — ABNORMAL LOW (ref 6–20)
CALCIUM: 8.5 mg/dL — AB (ref 8.9–10.3)
CO2: 19 mmol/L — ABNORMAL LOW (ref 22–32)
CREATININE: 0.79 mg/dL (ref 0.44–1.00)
Chloride: 106 mmol/L (ref 101–111)
Glucose, Bld: 97 mg/dL (ref 65–99)
Potassium: 4 mmol/L (ref 3.5–5.1)
SODIUM: 137 mmol/L (ref 135–145)

## 2016-03-16 LAB — CBC
HCT: 36.7 % (ref 36.0–46.0)
Hemoglobin: 12.2 g/dL (ref 12.0–15.0)
MCH: 23.5 pg — ABNORMAL LOW (ref 26.0–34.0)
MCHC: 33.2 g/dL (ref 30.0–36.0)
MCV: 70.6 fL — ABNORMAL LOW (ref 78.0–100.0)
PLATELETS: 415 10*3/uL — AB (ref 150–400)
RBC: 5.2 MIL/uL — AB (ref 3.87–5.11)
RDW: 15.6 % — ABNORMAL HIGH (ref 11.5–15.5)
WBC: 14.2 10*3/uL — AB (ref 4.0–10.5)

## 2016-03-16 LAB — GRAM STAIN

## 2016-03-16 LAB — EXPECTORATED SPUTUM ASSESSMENT W REFEX TO RESP CULTURE

## 2016-03-16 LAB — EXPECTORATED SPUTUM ASSESSMENT W GRAM STAIN, RFLX TO RESP C

## 2016-03-16 LAB — STREP PNEUMONIAE URINARY ANTIGEN: Strep Pneumo Urinary Antigen: NEGATIVE

## 2016-03-16 LAB — I-STAT CG4 LACTIC ACID, ED: Lactic Acid, Venous: 1.74 mmol/L (ref 0.5–2.0)

## 2016-03-16 MED ORDER — ALBUTEROL SULFATE HFA 108 (90 BASE) MCG/ACT IN AERS: 2.0000 | INHALATION_SPRAY | RESPIRATORY_TRACT | Status: DC | PRN

## 2016-03-16 MED ORDER — SODIUM CHLORIDE 0.9 % IV SOLN
Freq: Once | INTRAVENOUS | Status: AC
  Administered 2016-03-16: 04:00:00 via INTRAVENOUS

## 2016-03-16 MED ORDER — ALBUTEROL SULFATE (2.5 MG/3ML) 0.083% IN NEBU: 2.5000 mg | INHALATION_SOLUTION | RESPIRATORY_TRACT | Status: DC | PRN

## 2016-03-16 MED ORDER — GUAIFENESIN 100 MG/5ML PO SOLN
10.0000 mL | ORAL | Status: DC | PRN
  Administered 2016-03-16 – 2016-03-20 (×14): 200 mg via ORAL
  Administered 2016-03-23 (×2): 100 mg via ORAL
  Filled 2016-03-16 (×2): qty 10
  Filled 2016-03-16 (×3): qty 5
  Filled 2016-03-16 (×6): qty 10
  Filled 2016-03-16: qty 5
  Filled 2016-03-16 (×5): qty 10

## 2016-03-16 MED ORDER — HYDROCODONE-ACETAMINOPHEN 5-325 MG PO TABS
1.0000 | ORAL_TABLET | ORAL | Status: DC | PRN
  Administered 2016-03-16 – 2016-03-19 (×7): 1 via ORAL
  Filled 2016-03-16 (×7): qty 1

## 2016-03-16 MED ORDER — ENOXAPARIN SODIUM 60 MG/0.6ML ~~LOC~~ SOLN
60.0000 mg | SUBCUTANEOUS | Status: DC
  Administered 2016-03-18 – 2016-03-23 (×5): 60 mg via SUBCUTANEOUS
  Filled 2016-03-16 (×5): qty 0.6

## 2016-03-16 MED ORDER — ONDANSETRON HCL 4 MG PO TABS: 4.0000 mg | ORAL_TABLET | Freq: Four times a day (QID) | ORAL | Status: DC

## 2016-03-16 MED ORDER — AZITHROMYCIN 250 MG PO TABS: 250.0000 mg | ORAL_TABLET | Freq: Every day | ORAL | Status: DC

## 2016-03-16 MED ORDER — IPRATROPIUM-ALBUTEROL 0.5-2.5 (3) MG/3ML IN SOLN: 3.0000 mL | Freq: Four times a day (QID) | RESPIRATORY_TRACT | Status: DC

## 2016-03-16 MED ORDER — ONDANSETRON HCL 4 MG/2ML IJ SOLN
4.0000 mg | Freq: Four times a day (QID) | INTRAMUSCULAR | Status: DC | PRN
  Administered 2016-03-16 – 2016-03-19 (×3): 4 mg via INTRAVENOUS
  Filled 2016-03-16 (×3): qty 2

## 2016-03-16 MED ORDER — IPRATROPIUM-ALBUTEROL 0.5-2.5 (3) MG/3ML IN SOLN
3.0000 mL | RESPIRATORY_TRACT | Status: DC | PRN
  Administered 2016-03-17: 3 mL via RESPIRATORY_TRACT
  Filled 2016-03-16: qty 3

## 2016-03-16 MED ORDER — SODIUM CHLORIDE 0.9 % IV SOLN: INTRAVENOUS | Status: DC

## 2016-03-16 MED ORDER — DEXTROSE 5 % IV SOLN
500.0000 mg | INTRAVENOUS | Status: DC
  Administered 2016-03-16 – 2016-03-20 (×5): 500 mg via INTRAVENOUS
  Filled 2016-03-16 (×6): qty 500

## 2016-03-16 MED ORDER — METHYLPREDNISOLONE SODIUM SUCC 125 MG IJ SOLR
125.0000 mg | INTRAMUSCULAR | Status: AC
  Administered 2016-03-16: 125 mg via INTRAVENOUS
  Filled 2016-03-16: qty 2

## 2016-03-16 MED ORDER — GUAIFENESIN ER 600 MG PO TB12
600.0000 mg | ORAL_TABLET | Freq: Two times a day (BID) | ORAL | Status: DC
  Administered 2016-03-16 – 2016-03-23 (×15): 600 mg via ORAL
  Filled 2016-03-16 (×15): qty 1

## 2016-03-16 MED ORDER — DEXTROSE 5 % IV SOLN
1.0000 g | INTRAVENOUS | Status: AC
  Administered 2016-03-16 – 2016-03-22 (×7): 1 g via INTRAVENOUS
  Filled 2016-03-16 (×7): qty 10

## 2016-03-16 MED ORDER — BENZONATATE 100 MG PO CAPS
200.0000 mg | ORAL_CAPSULE | Freq: Three times a day (TID) | ORAL | Status: DC | PRN
  Administered 2016-03-17 (×2): 200 mg via ORAL
  Filled 2016-03-16 (×3): qty 2

## 2016-03-16 NOTE — ED Notes (Signed)
Pt ambulated independently in hallway. 02 sat remained 95%, HR 120s. Pt complained of increased coughing and shortness of breath with ambulation.

## 2016-03-16 NOTE — Discharge Instructions (Signed)

## 2016-03-16 NOTE — Progress Notes (Signed)
[facility]  PROGRESS NOTE  Kiara Leach ZOX:[id number] DOB: 02-01-1976 DOA: 03/15/2016 PCP: No primary care provider on file.  Outpatient specialist:  Brief HPI:  40 y.o. female with medical history significant of back pain; who presents with complaints of productive cough, wheezing, and shortness breath. Symptoms have been progressively worsening over the last 2 weeks. She expresses that she's had a productive cough with thick sputum production. Associated symptoms include a lipase, intermittent subjective fevers, nausea, vomiting, diarrhea, pleuritic back pain, and chest pain secondary to coughing. She reports utilizing Mucinex and Tylenol cough syrup without relief of symptoms. Patient endorses a remote history of tobacco use.  Principal Problem:   Sepsis (HCC) Active Problems:   CAP (community acquired pneumonia)   Nausea with vomiting   Sinus tachycardia (HCC)   Assessment/Plan: 1. Community-acquired pneumonia- chest x-ray shows left lingular pneumonia, urinary strep pneumo antigen negative. Continue ceftriaxone and Zithromax. WBC is down from 17.9-14.2. Follow CBC in a.m. Continue when necessary DuoNeb every 4 hours 2. Nausea vomiting diarrhea- likely gastroenteritis, improving continue Zofran when necessary.     DVT prophylaxis: Lovenox Code Status: Full code Family Communication: No family at bedside Disposition Plan: Pending improvement in pneumonia   Consultants:  None  Procedures:  None  Antibiotics:  Ceftriaxone for 03/15/2016  Zithromax 03/15/2016  Subjective: Patient seen and examined, bleeding is better. Still coughing up phlegm. Says the color of the phlegm has changed from yellow-green to white.  Objective: Filed Vitals:   03/16/16 0030 03/16/16 0310 03/16/16 0332 03/16/16 0548  BP: 101/56  104/47 108/48  Pulse: 113  103 90  Temp:   98.6 F (37 C) 98.8 F (37.1 C)  TempSrc:   Oral Oral  Resp:   18 18  Height:   (1.6 m)    Weight:   117.482 kg (259 lb)    SpO2: 96%  99% 94%    Intake/Output Summary (Last 24 hours) at 03/16/16 1444 Last data filed at 03/16/16 0930  Gross per 24 hour  Intake    170 ml  Output      0 ml  Net    170 ml   Filed Weights   03/15/16 1952 03/16/16 0310  Weight: 117.028 kg (258 lb) 117.482 kg (259 lb)    Examination:  General exam: Appears calm and comfortable  Respiratory system: Bibasilar rhonchi Respiratory effort normal. Cardiovascular system: S1 & S2 heard, RRR. No JVD, murmurs, rubs, gallops or clicks. No pedal edema. Gastrointestinal system: Abdomen is nondistended, soft and nontender. No organomegaly or masses felt. Normal bowel sounds heard. Central nervous system: Alert and oriented. No focal neurological deficits. Extremities: Symmetric 5 x 5 power. Skin: No rashes, lesions or ulcers Psychiatry: Judgement and insight appear normal. Mood & affect appropriate.    Data Reviewed: I have personally reviewed following labs and imaging studies Basic Metabolic Panel:  Recent Labs Lab 03/15/16 2045 03/16/16 0429  NA 138 137  K 4.0 4.0  CL 105 106  CO2 21* 19*  GLUCOSE 138* 97  BUN <5* <5*  CREATININE 0.84 0.79  CALCIUM 9.3 8.5*   Liver Function Tests:  Recent Labs Lab 03/15/16 2045  AST 39  ALT 32  ALKPHOS 110  BILITOT 0.5  PROT 8.0  ALBUMIN 3.2*     Recent Labs Lab 03/15/16 2045 03/16/16 0429  WBC 17.9* 14.2*  HGB 13.8 12.2  HCT 40.9 36.7  MCV 71.8* 70.6*  PLT 387 415*    CBG: No results for input(s): GLUCAP  in the last 168 hours.  Recent Results (from the past 240 hour(s))  Culture, sputum-assessment     Status: None   Collection Time: 03/16/16  4:57 AM  Result Value Ref Range Status   Specimen Description EXPECTORATED SPUTUM  Final   Special Requests NONE  Final   Sputum evaluation   Final    THIS SPECIMEN IS ACCEPTABLE. RESPIRATORY CULTURE REPORT TO FOLLOW.   Report Status 03/16/2016 FINAL  Final  Gram stain     Status: None    Collection Time: 03/16/16  4:57 AM  Result Value Ref Range Status   Specimen Description EXPECTORATED SPUTUM  Final   Special Requests NONE  Final   Gram Stain   Final    FEW WBC PRESENT,BOTH PMN AND MONONUCLEAR MODERATE GRAM POSITIVE COCCI IN PAIRS FEW GRAM NEGATIVE RODS RARE GRAM POSITIVE RODS    Report Status 03/16/2016 FINAL  Final     Studies: Dg Chest 2 View  03/15/2016  CLINICAL DATA:  Subacute onset of syncope, shortness of breath and chest pain. Congestion. Initial encounter. EXAM: CHEST  2 VIEW COMPARISON:  None. FINDINGS: The lungs are well-aerated. Left lingular opacity is compatible with pneumonia. There is no evidence of pleural effusion or pneumothorax. The heart is normal in size; the mediastinal contour is within normal limits. No acute osseous abnormalities are seen. IMPRESSION: Left lingular pneumonia noted. Followup PA and lateral chest X-ray is recommended in 3-4 weeks following trial of antibiotic therapy to ensure resolution and exclude underlying malignancy. Electronically Signed   By: Roanna RaiderJeffery  Chang M.D.   On: 03/15/2016 20:50    Scheduled Meds: . cefTRIAXone (ROCEPHIN)  IV  1 g Intravenous Q24H  . enoxaparin (LOVENOX) injection  60 mg Subcutaneous Q24H  . guaiFENesin  600 mg Oral BID   Continuous Infusions:      Time spent: 25 min    [facility]  [facility] Pager [phone]. If 7PM-7AM, please contact night-coverage at [email or web address], Office  [phone]  password [email or web address] 03/16/2016, 2:44 PM  LOS: 0 days

## 2016-03-16 NOTE — Progress Notes (Signed)
NURSING PROGRESS NOTE  Kiara Leach [id number] Admission Data: 03/16/2016 5:10 AM Attending Provider: No att. providers found PCP:No primary care provider on file. Code Status: Full  Allergies:  Morphine and related and Motrin Past Medical History:   has a past medical history of Back pain. Past Surgical History:   has past surgical history that includes Cholecystectomy. Social History:   reports that she has quit smoking. She does not have any smokeless tobacco history on file. She reports that she does not drink alcohol or use illicit drugs.  Kiara Leach is a 40 y.o. female patient admitted from ED:   Last Documented Vital Signs: Blood pressure 104/47, pulse 103, temperature 98.6 F (37 C), temperature source Oral, resp. rate 18, height 5\' 3"  (1.6 m), weight 117.482 kg (259 lb), last menstrual period 02/20/2016, SpO2 99 %.   IV Fluids:  IV in place, occlusive dsg intact without redness, IV cath hand right, condition patent and no redness normal saline.   Skin: Intact and appropriate for ethnicity. Tattoos noted on left neck and left shoulder.  Patient orientated to room. Information packet given to patient. Admission inpatient armband information verified with patient to include name and date of birth and placed on patient arm. Side rails up x 2, fall assessment and education completed with patient. Patient able to verbalize understanding of risk associated with falls and verbalized understanding to call for assistance before getting out of bed. Call light within reach. Patient able to voice and demonstrate understanding of unit orientation instructions.    Will continue to evaluate and treat per MD orders.  Sue LushKaelin Romesberg RN, BSN

## 2016-03-16 NOTE — H&P (Signed)
History and Physical    Kiara Leach GUY:[id number] DOB: December 23, 1975 DOA: 03/15/2016  Referring MD/NP/PA: Ellin Saba, PA PCP: No primary care provider on file.  Outpatient Specialists: None Patient coming from: Home  Chief Complaint: Cough, wheezing, and shortness of breath  HPI: Kiara Leach is a 40 y.o. female with medical history significant of back pain; who presents with complaints of productive cough, wheezing, and shortness breath. Symptoms have been progressively worsening over the last 2 weeks.  She expresses that she's had a productive cough with thick sputum production.  Associated symptoms include a lipase, intermittent subjective fevers, nausea, vomiting, diarrhea, pleuritic back pain, and chest pain secondary to coughing. She reports utilizing Mucinex and Tylenol cough syrup without relief of symptoms. Patient endorses a remote history of tobacco use.  ED Course: Upon evaluation the patient was evaluated in the emergency department and seen have a left lingula infiltrate on chest x-ray.  She was given breathing treatments, azithromycin, and set up for discharge.  However, patient had significant labored breathing with ambulation although no desaturations. Patient complaining of significant generalized weakness for which [facility] called for admission.    Review of Systems: As per HPI otherwise 10 point review of systems negative.    Past Medical History  Diagnosis Date  . Back pain     Past Surgical History  Procedure Laterality Date  . Cholecystectomy       reports that she has quit smoking. She does not have any smokeless tobacco history on file. She reports that she does not drink alcohol or use illicit drugs.  Allergies  Allergen Reactions  . Morphine And Related Hives and Itching    Burning on skin  . Motrin [Ibuprofen] Hives and Itching    Burning on skin     No family history on file.  Prior to Admission medications   Medication Sig Start Date End Date Taking?  Authorizing Provider  aspirin-acetaminophen-caffeine (EXCEDRIN EXTRA STRENGTH) [phone] MG per tablet Take 3 tablets by mouth every 6 (six) hours as needed (back pain).   Yes Historical Provider, MD  azithromycin (ZITHROMAX) 250 MG tablet Take 1 tablet (250 mg total) by mouth daily. Take first 2 tablets together, then 1 every day until finished. 03/16/16   Tiffany Neva Seat, PA-C  ondansetron (ZOFRAN) 4 MG tablet Take 1 tablet (4 mg total) by mouth every 6 (six) hours. 03/16/16   Marlon Pel, PA-C    Physical Exam: Filed Vitals:   03/15/16 2231 03/15/16 2330 03/16/16 0000 03/16/16 0030  BP:  114/72 113/67 101/56  Pulse: 101 117 110 113  Temp: 98.3 F (36.8 C)     TempSrc: Oral     Resp:  31 32   Height:      Weight:      SpO2: 98% 97% 96% 96%      Constitutional:Sickly appearance. Filed Vitals:   03/15/16 2231 03/15/16 2330 03/16/16 0000 03/16/16 0030  BP:  114/72 113/67 101/56  Pulse: 101 117 110 113  Temp: 98.3 F (36.8 C)     TempSrc: Oral     Resp:  31 32   Height:      Weight:      SpO2: 98% 97% 96% 96%   Eyes: PERRL, lids and conjunctivae normal ENMT: Mucous membranes are moist. Posterior pharynx clear of any exudate or lesions.Normal dentition.  Neck: normal, supple, no masses, no thyromegaly Respiratory: Decreased aeration with expiratory wheeze present. Harsh cough with thick sputum production.  Cardiovascular: Tachycardic. No extremity edema. 2+  pedal pulses. No carotid bruits.  Abdomen: no tenderness, no masses palpated. No hepatosplenomegaly. Bowel sounds positive.  Musculoskeletal: no clubbing / cyanosis. No joint deformity upper and lower extremities. Good ROM, no contractures. Normal muscle tone.  Skin: no rashes, lesions, ulcers. No induration Neurologic: CN 2-12 grossly intact. Sensation intact, DTR normal. Strength 5/5 in all 4.  Psychiatric: Normal judgment and insight. Alert and oriented x 3. Normal mood.   Labs on Admission: I have personally  reviewed following labs and imaging studies  CBC:  Recent Labs Lab 03/15/16 2045  WBC 17.9*  HGB 13.8  HCT 40.9  MCV 71.8*  PLT 387   Basic Metabolic Panel:  Recent Labs Lab 03/15/16 2045  NA 138  K 4.0  CL 105  CO2 21*  GLUCOSE 138*  BUN <5*  CREATININE 0.84  CALCIUM 9.3   GFR: Estimated Creatinine Clearance: 109.9 mL/min (by C-G formula based on Cr of 0.84). Liver Function Tests:  Recent Labs Lab 03/15/16 2045  AST 39  ALT 32  ALKPHOS 110  BILITOT 0.5  PROT 8.0  ALBUMIN 3.2*   No results for input(s): LIPASE, AMYLASE in the last 168 hours. No results for input(s): AMMONIA in the last 168 hours. Coagulation Profile: No results for input(s): INR, PROTIME in the last 168 hours. Cardiac Enzymes: No results for input(s): CKTOTAL, CKMB, CKMBINDEX, TROPONINI in the last 168 hours. BNP (last 3 results) No results for input(s): PROBNP in the last 8760 hours. HbA1C: No results for input(s): HGBA1C in the last 72 hours. CBG: No results for input(s): GLUCAP in the last 168 hours. Lipid Profile: No results for input(s): CHOL, HDL, LDLCALC, TRIG, CHOLHDL, LDLDIRECT in the last 72 hours. Thyroid Function Tests: No results for input(s): TSH, T4TOTAL, FREET4, T3FREE, THYROIDAB in the last 72 hours. Anemia Panel: No results for input(s): VITAMINB12, FOLATE, FERRITIN, TIBC, IRON, RETICCTPCT in the last 72 hours. Urine analysis:    Component Value Date/Time   COLORURINE YELLOW 03/15/2016 2043   APPEARANCEUR CLEAR 03/15/2016 2043   LABSPEC 1.013 03/15/2016 2043   PHURINE 6.5 03/15/2016 2043   GLUCOSEU NEGATIVE 03/15/2016 2043   HGBUR SMALL* 03/15/2016 2043   BILIRUBINUR NEGATIVE 03/15/2016 2043   KETONESUR 15* 03/15/2016 2043   PROTEINUR NEGATIVE 03/15/2016 2043   NITRITE NEGATIVE 03/15/2016 2043   LEUKOCYTESUR NEGATIVE 03/15/2016 2043   Sepsis Labs: (procalcitonin:4,lacticidven:4) )No results found for this or any previous visit (from the past 240  hour(s)).   Radiological Exams on Admission: Dg Chest 2 View  03/15/2016  CLINICAL DATA:  Subacute onset of syncope, shortness of breath and chest pain. Congestion. Initial encounter. EXAM: CHEST  2 VIEW COMPARISON:  None. FINDINGS: The lungs are well-aerated. Left lingular opacity is compatible with pneumonia. There is no evidence of pleural effusion or pneumothorax. The heart is normal in size; the mediastinal contour is within normal limits. No acute osseous abnormalities are seen. IMPRESSION: Left lingular pneumonia noted. Followup PA and lateral chest X-ray is recommended in 3-4 weeks following trial of antibiotic therapy to ensure resolution and exclude underlying malignancy. Electronically Signed   By: Roanna Raider M.D.   On: 03/15/2016 20:50      Assessment/Plan Sepsis secondary to community acquired pneumonia: Patient with 2 week history of productive cough. Patient tachypneic, tachycardic, and WBC elevated at 17.9. CXR showing left lingula anemia. Patient still labored after several breathing treatments. - Admit to MedSurg bed - Continuous pulse oximetry withoxygen to keep O2 sats greater than 90  - NS @  100cc/hr - Solumedrol 125 mg IV x 1 dose, now - Fever control prn tylenol - Repeat CBC in am - Follow-up Sputum & Blood cultures - Duonebs scheduled every 4 hr PRN SOB/Wheezing  - mucinex   Nausea/vomiting/diarrhea - Zofran prn  - Investigate diarrhea further it persist  Sinus tachycardia: Improved with IV fluids  - Continue to monitor   DVT prophylaxis: Lovenox Code Status: Full Family Communication: Discussed plan with boyfriend present in room  Disposition Plan: Patient will likely be able to be discharged home in 1-2 days Consults called: none Admission status: Observation medsurg   Clydie Braunondell A Smith MD [facility] Pager [phone]  If 7PM-7AM, please contact night-coverage [email or web address] Password [email or web address]  03/16/2016, 1:38 AM

## 2016-03-16 NOTE — Care Management Note (Signed)
Case Management Note  Patient Details  Name: Kiara Leach MRN: [id number] Date of Birth: 04/09/1976  Subjective/Objective:                 Spoke to patient at the bedside. She states that she moved to [place] from [place] about a year ago and [place] in [place] with her niece. She states her niece drives, but she does not. She is covered with Medicaid, and states that her medical group is "[facility]" on her card. CM advised patient to go to DSS to get MD on medicaid card after discharge. Patient stated understanding and knew where to go. Patient denies any HH or medication needs.    Action/Plan:  Anticipate DC to home, self care.  Expected Discharge Date:                  Expected Discharge Plan:  Home/Self Care (lives with neice)  In-House Referral:     Discharge planning Services  CM Consult  Post Acute Care Choice:    Choice offered to:     DME Arranged:    DME Agency:     HH Arranged:    HH Agency:     Status of Service:  In process, will continue to follow  Medicare Important Message Given:    Date Medicare IM Given:    Medicare IM give by:    Date Additional Medicare IM Given:    Additional Medicare Important Message give by:     If discussed at Long Length of Stay Meetings, dates discussed:    Additional Comments:  Lawerance SabalDebbie Rainn Bullinger, RN 03/16/2016, 12:31 PM

## 2016-03-16 NOTE — Progress Notes (Signed)
Received report from ED RN.

## 2016-03-17 LAB — CBC
HCT: 37.4 % (ref 36.0–46.0)
HEMOGLOBIN: 12.3 g/dL (ref 12.0–15.0)
MCH: 23.5 pg — AB (ref 26.0–34.0)
MCHC: 32.9 g/dL (ref 30.0–36.0)
MCV: 71.4 fL — AB (ref 78.0–100.0)
Platelets: 400 10*3/uL (ref 150–400)
RBC: 5.24 MIL/uL — AB (ref 3.87–5.11)
RDW: 15.8 % — ABNORMAL HIGH (ref 11.5–15.5)
WBC: 17.4 10*3/uL — ABNORMAL HIGH (ref 4.0–10.5)

## 2016-03-17 LAB — BASIC METABOLIC PANEL
Anion gap: 12 (ref 5–15)
BUN: 9 mg/dL (ref 6–20)
CHLORIDE: 107 mmol/L (ref 101–111)
CO2: 20 mmol/L — ABNORMAL LOW (ref 22–32)
Calcium: 9.2 mg/dL (ref 8.9–10.3)
Creatinine, Ser: 0.85 mg/dL (ref 0.44–1.00)
GFR calc Af Amer: >60 mL/min (ref >60)
GFR calc non Af Amer: >60 mL/min (ref >60)
Glucose, Bld: 108 mg/dL — ABNORMAL HIGH (ref 65–99)
POTASSIUM: 4 mmol/L (ref 3.5–5.1)
SODIUM: 139 mmol/L (ref 135–145)

## 2016-03-17 MED ORDER — ALBUTEROL SULFATE (2.5 MG/3ML) 0.083% IN NEBU
2.5000 mg | INHALATION_SOLUTION | RESPIRATORY_TRACT | Status: DC | PRN
  Administered 2016-03-17 – 2016-03-21 (×5): 2.5 mg via RESPIRATORY_TRACT
  Filled 2016-03-17 (×5): qty 3

## 2016-03-17 MED ORDER — ALBUTEROL SULFATE (2.5 MG/3ML) 0.083% IN NEBU
2.5000 mg | INHALATION_SOLUTION | Freq: Four times a day (QID) | RESPIRATORY_TRACT | Status: DC
  Administered 2016-03-17: 2.5 mg via RESPIRATORY_TRACT
  Filled 2016-03-17: qty 3

## 2016-03-17 MED ORDER — SODIUM CHLORIDE 0.9 % IV BOLUS (SEPSIS)
500.0000 mL | Freq: Once | INTRAVENOUS | Status: AC
  Administered 2016-03-17: 500 mL via INTRAVENOUS

## 2016-03-17 MED ORDER — ACETAMINOPHEN 325 MG PO TABS
650.0000 mg | ORAL_TABLET | Freq: Four times a day (QID) | ORAL | Status: DC | PRN
  Administered 2016-03-17 – 2016-03-22 (×8): 650 mg via ORAL
  Filled 2016-03-17 (×8): qty 2

## 2016-03-17 NOTE — Clinical Documentation Improvement (Addendum)
Hospitalist  Please document query responses in the progress notes and discharge summary, not on the CDI BPA itself.   Thank you.  Please document if condition below provides greater specificity regarding the patient's height and weight:   Possible Clinical Conditions:  - Morbid Obesity, BMI 45.89  - Other condition  - Unable to clinically determine  Clinical Information: BMI 45.89 autoloaded to  H&P Height 5"3" Weight 259 lbs  Please exercise your independent, professional judgment when responding. A specific answer is not anticipated or expected.   Thank You, Jerral Ralphathy R Maygan Koeller  RN BSN [facility] [phone] [employer]

## 2016-03-17 NOTE — Progress Notes (Signed)
[facility]  PROGRESS NOTE  Attie Nawabi ZOX:[id number] DOB: 02/23/1976 DOA: 03/15/2016 PCP: No primary care provider on file.  Outpatient specialist:  Brief HPI:  40 y.o. female with medical history significant of back pain; who presents with complaints of productive cough, wheezing, and shortness breath. Symptoms have been progressively worsening over the last 2 weeks. She expresses that she's had a productive cough with thick sputum production. Associated symptoms include a lipase, intermittent subjective fevers, nausea, vomiting, diarrhea, pleuritic back pain, and chest pain secondary to coughing. She reports utilizing Mucinex and Tylenol cough syrup without relief of symptoms. Patient endorses a remote history of tobacco use.  Principal Problem:   Sepsis (HCC) Active Problems:   CAP (community acquired pneumonia)   Nausea with vomiting   Sinus tachycardia (HCC)   Assessment/Plan: 1. Community-acquired pneumonia- chest x-ray shows left lingular pneumonia, urinary strep pneumo antigen negative. Continue ceftriaxone and Zithromax. WBC is back up 17.4 Follow CBC in a.m. Continue when necessary DuoNeb every 4 hours 2. Nausea vomiting diarrhea- likely gastroenteritis, improving continue Zofran when necessary.     DVT prophylaxis: Lovenox Code Status: Full code Family Communication: spoke with patient directly  Disposition Plan: Pending improvement in pneumonia   Consultants:  None  Procedures:  None  Antibiotics:  Ceftriaxone for 03/15/2016  Zithromax 03/15/2016  Subjective: Pt has no new complaints.   Objective: Filed Vitals:   03/17/16 0544 03/17/16 0759 03/17/16 1320 03/17/16 1453  BP: 119/67 109/55 158/106   Pulse: 76 68 80   Temp:   98.3 F (36.8 C)   TempSrc:   Oral   Resp:   24   Height:      Weight:      SpO2: 98%  92% 99%    Intake/Output Summary (Last 24 hours) at 03/17/16 1734 Last data filed at 03/17/16 0600  Gross per 24 hour  Intake     725 ml  Output   1000 ml  Net   -275 ml   Filed Weights   03/15/16 1952 03/16/16 0310  Weight: 117.028 kg (258 lb) 117.482 kg (259 lb)    Examination:  General exam: Appears calm and comfortable  Respiratory system: Bibasilar rhonchi Respiratory effort normal. Cardiovascular system: S1 & S2 heard, RRR. No JVD, murmurs, rubs, gallops or clicks. No pedal edema. Gastrointestinal system: Abdomen is nondistended, soft and nontender. No organomegaly or masses felt. Normal bowel sounds heard. Central nervous system: Alert and oriented. No focal neurological deficits. Skin: No rashes, lesions or ulcers Psychiatry: Judgement and insight appear normal. Mood & affect appropriate.    Data Reviewed: I have personally reviewed following labs and imaging studies Basic Metabolic Panel:  Recent Labs Lab 03/15/16 2045 03/16/16 0429 03/17/16 0627  NA 138 137 139  K 4.0 4.0 4.0  CL 105 106 107  CO2 21* 19* 20*  GLUCOSE 138* 97 108*  BUN <5* <5* 9  CREATININE 0.84 0.79 0.85  CALCIUM 9.3 8.5* 9.2   Liver Function Tests:  Recent Labs Lab 03/15/16 2045  AST 39  ALT 32  ALKPHOS 110  BILITOT 0.5  PROT 8.0  ALBUMIN 3.2*     Recent Labs Lab 03/15/16 2045 03/16/16 0429 03/17/16 0627  WBC 17.9* 14.2* 17.4*  HGB 13.8 12.2 12.3  HCT 40.9 36.7 37.4  MCV 71.8* 70.6* 71.4*  PLT 387 415* 400    CBG: No results for input(s): GLUCAP in the last 168 hours.  Recent Results (from the past 240 hour(s))  Culture, blood (Routine X  2) w Reflex to ID Panel     Status: None (Preliminary result)   Collection Time: 03/16/16  1:29 AM  Result Value Ref Range Status   Specimen Description BLOOD LEFT WRIST  Final   Special Requests AEROBIC BOTTLE ONLY 3ML  Final   Culture NO GROWTH 1 DAY  Final   Report Status PENDING  Incomplete  Culture, blood (Routine X 2) w Reflex to ID Panel     Status: None (Preliminary result)   Collection Time: 03/16/16  1:32 AM  Result Value Ref Range Status    Specimen Description BLOOD LEFT HAND  Final   Special Requests BOTTLES DRAWN AEROBIC AND ANAEROBIC 5ML  Final   Culture NO GROWTH 1 DAY  Final   Report Status PENDING  Incomplete  Culture, sputum-assessment     Status: None   Collection Time: 03/16/16  4:57 AM  Result Value Ref Range Status   Specimen Description EXPECTORATED SPUTUM  Final   Special Requests NONE  Final   Sputum evaluation   Final    THIS SPECIMEN IS ACCEPTABLE. RESPIRATORY CULTURE REPORT TO FOLLOW.   Report Status 03/16/2016 FINAL  Final  Culture, respiratory (NON-Expectorated)     Status: None (Preliminary result)   Collection Time: 03/16/16  4:57 AM  Result Value Ref Range Status   Specimen Description EXPECTORATED SPUTUM  Final   Special Requests NONE  Final   Gram Stain   Final    FEW WBC PRESENT,BOTH PMN AND MONONUCLEAR FEW SQUAMOUS EPITHELIAL CELLS PRESENT MODERATE GRAM POSITIVE COCCI IN PAIRS IN CHAINS FEW GRAM NEGATIVE RODS THIS SPECIMEN IS ACCEPTABLE FOR SPUTUM CULTURE Performed at [facility]    Culture   Final    Culture reincubated for better growth Performed at [facility]    Report Status PENDING  Incomplete  Gram stain     Status: None   Collection Time: 03/16/16  4:57 AM  Result Value Ref Range Status   Specimen Description EXPECTORATED SPUTUM  Final   Special Requests NONE  Final   Gram Stain   Final    FEW WBC PRESENT,BOTH PMN AND MONONUCLEAR MODERATE GRAM POSITIVE COCCI IN PAIRS FEW GRAM NEGATIVE RODS RARE GRAM POSITIVE RODS    Report Status 03/16/2016 FINAL  Final     Studies: Dg Chest 2 View  03/15/2016  CLINICAL DATA:  Subacute onset of syncope, shortness of breath and chest pain. Congestion. Initial encounter. EXAM: CHEST  2 VIEW COMPARISON:  None. FINDINGS: The lungs are well-aerated. Left lingular opacity is compatible with pneumonia. There is no evidence of pleural effusion or pneumothorax. The heart is normal in size; the mediastinal contour is within normal  limits. No acute osseous abnormalities are seen. IMPRESSION: Left lingular pneumonia noted. Followup PA and lateral chest X-ray is recommended in 3-4 weeks following trial of antibiotic therapy to ensure resolution and exclude underlying malignancy. Electronically Signed   By: Roanna RaiderJeffery  Chang M.D.   On: 03/15/2016 20:50    Scheduled Meds: . azithromycin  500 mg Intravenous Q24H  . cefTRIAXone (ROCEPHIN)  IV  1 g Intravenous Q24H  . enoxaparin (LOVENOX) injection  60 mg Subcutaneous Q24H  . guaiFENesin  600 mg Oral BID   Continuous Infusions:    Time spent: 25 min    Penny PiaVEGA, Paola Flynt  [facility] Pager [phone] If 7PM-7AM, please contact night-coverage at [email or web address], Office  [phone]  password [email or web address] 03/17/2016, 5:34 PM  LOS: 1 day

## 2016-03-17 NOTE — Progress Notes (Signed)
Patient BP 75/40. MD notified. 500 cc fluid bolus ordered. Patient complaining of shortness of breath with uncontrollable productive cough and "unable to catch breath." PRN nebulizer given and respiratory therapist called. Patient stable, says she feels better. Will continue to monitor.

## 2016-03-18 ENCOUNTER — Inpatient Hospital Stay (HOSPITAL_COMMUNITY): Payer: Medicaid Other

## 2016-03-18 LAB — CBC
HCT: 38.4 % (ref 36.0–46.0)
HCT: 35.5 % — ABNORMAL LOW (ref 36.0–46.0)
Hemoglobin: 12.7 g/dL (ref 12.0–15.0)
Hemoglobin: 11.4 g/dL — ABNORMAL LOW (ref 12.0–15.0)
MCH: 23.7 pg — AB (ref 26.0–34.0)
MCH: 23.3 pg — AB (ref 26.0–34.0)
MCHC: 33.1 g/dL (ref 30.0–36.0)
MCHC: 32.1 g/dL (ref 30.0–36.0)
MCV: 71.6 fL — ABNORMAL LOW (ref 78.0–100.0)
MCV: 72.6 fL — AB (ref 78.0–100.0)
PLATELETS: 380 10*3/uL (ref 150–400)
PLATELETS: 353 10*3/uL (ref 150–400)
RBC: 5.36 MIL/uL — ABNORMAL HIGH (ref 3.87–5.11)
RBC: 4.89 MIL/uL (ref 3.87–5.11)
RDW: 15.9 % — ABNORMAL HIGH (ref 11.5–15.5)
RDW: 15.8 % — AB (ref 11.5–15.5)
WBC: 12 10*3/uL — ABNORMAL HIGH (ref 4.0–10.5)
WBC: 11.8 10*3/uL — AB (ref 4.0–10.5)

## 2016-03-18 LAB — TROPONIN I

## 2016-03-18 LAB — CULTURE, RESPIRATORY W GRAM STAIN

## 2016-03-18 LAB — CULTURE, RESPIRATORY: CULTURE: NORMAL

## 2016-03-18 MED ORDER — LORAZEPAM 2 MG/ML IJ SOLN
0.5000 mg | Freq: Once | INTRAMUSCULAR | Status: AC
  Administered 2016-03-18: 0.5 mg via INTRAVENOUS
  Filled 2016-03-18: qty 1

## 2016-03-18 MED ORDER — BUDESONIDE 0.5 MG/2ML IN SUSP
0.5000 mg | Freq: Once | RESPIRATORY_TRACT | Status: AC
  Administered 2016-03-18: 0.5 mg via RESPIRATORY_TRACT
  Filled 2016-03-18: qty 2

## 2016-03-18 MED ORDER — FENTANYL CITRATE (PF) 100 MCG/2ML IJ SOLN
12.5000 ug | INTRAMUSCULAR | Status: DC | PRN
  Administered 2016-03-19: 12.5 ug via INTRAVENOUS
  Filled 2016-03-18: qty 2

## 2016-03-18 MED ORDER — BENZONATATE 100 MG PO CAPS
200.0000 mg | ORAL_CAPSULE | Freq: Three times a day (TID) | ORAL | Status: DC
  Administered 2016-03-18 – 2016-03-23 (×15): 200 mg via ORAL
  Filled 2016-03-18 (×15): qty 2

## 2016-03-18 MED ORDER — METHYLPREDNISOLONE SODIUM SUCC 40 MG IJ SOLR
40.0000 mg | Freq: Three times a day (TID) | INTRAMUSCULAR | Status: DC
  Administered 2016-03-18 – 2016-03-22 (×12): 40 mg via INTRAVENOUS
  Filled 2016-03-18 (×12): qty 1

## 2016-03-18 NOTE — Progress Notes (Signed)
Patient complaining of chest tightness and experiencing SOB and DOE with some dizziness upon standing. Respiratory called to give available PRN breathing treatments and MD paged. [facility] called to come assess patient. Will continue to monitor.

## 2016-03-18 NOTE — Progress Notes (Signed)
Called per floor RN at 2045 regarding Pt with worsening SOB. Upon my arrival Pt found in bed, RR 30s, Po2 98-100% on 3 LNC. Lung sounds diminished through out. Pt complains of 8/10 pain in mid chest worsening with cough. She states that this pain is worse tonight than before. CXR and EKG done per RRT Protocol. [facility] NP paged and updated prior to my arrival, Pulmicort NEB ordered and in process. Benedetto Coons. Callahan NP paged at 2117 to follow up. NP to review CXR when available. Ativan, Fentanyl and IV steroids ordered tonight. Pt updated on plan of care. RN to follow up with Provider and myself for worsening changes. RRT to follow tonight.

## 2016-03-18 NOTE — Progress Notes (Signed)
[facility]  PROGRESS NOTE  Kiara Leach JYN:[id number] DOB: 12/28/1975 DOA: 03/15/2016 PCP: No primary care provider on file.  Outpatient specialist:  Brief HPI:  40 y.o. female with medical history significant of back pain; who presents with complaints of productive cough, wheezing, and shortness breath. Symptoms have been progressively worsening over the last 2 weeks. She expresses that she's had a productive cough with thick sputum production. Associated symptoms include a lipase, intermittent subjective fevers, nausea, vomiting, diarrhea, pleuritic back pain, and chest pain secondary to coughing. She reports utilizing Mucinex and Tylenol cough syrup without relief of symptoms. Patient endorses a remote history of tobacco use.  Principal Problem:   Sepsis (HCC) Active Problems:   CAP (community acquired pneumonia)   Nausea with vomiting   Sinus tachycardia (HCC)   Assessment/Plan: 1. Community-acquired pneumonia- chest x-ray shows left lingular pneumonia, urinary strep pneumo antigen negative. Continue ceftriaxone and Zithromax. WBC is trending down currently. Follow CBC in a.m. Continue when necessary DuoNeb every 4 hours. Continue supportive therapy with tessalon pearles  2. Nausea vomiting diarrhea- likely gastroenteritis, improving continue Zofran when necessary.   DVT prophylaxis: Lovenox Code Status: Full code Family Communication: spoke with patient directly  Disposition Plan: Pending improvement in pneumonia   Consultants:  None  Procedures:  None  Antibiotics:  Ceftriaxone for 03/15/2016  Zithromax 03/15/2016  Subjective: Pt has no new complaints. No acute issues overnight. Still coughing lots. But reports improvement in respiratory condition.  Objective: Filed Vitals:   03/17/16 2054 03/17/16 2346 03/18/16 0552 03/18/16 1410  BP:  108/67 103/89 109/76  Pulse:  100 100 93  Temp:  97.1 F (36.2 C) 98.1 F (36.7 C) 98 F (36.7 C)  TempSrc:    Oral   Resp:  20 18 18   Height:      Weight:      SpO2: 96% 95% 97% 97%    Intake/Output Summary (Last 24 hours) at 03/18/16 1633 Last data filed at 03/18/16 1409  Gross per 24 hour  Intake    850 ml  Output      0 ml  Net    850 ml   Filed Weights   03/15/16 1952 03/16/16 0310  Weight: 117.028 kg (258 lb) 117.482 kg (259 lb)    Examination:  General exam: Awake and alert, in nad Respiratory system: Bibasilar rhonchi Respiratory effort normal. Cardiovascular system: S1 & S2 heard, RRR. No JVD, murmurs, rubs, gallops or clicks. No pedal edema. Gastrointestinal system: Abdomen is nondistended, soft and nontender. No organomegaly or masses felt. Normal bowel sounds heard. Central nervous system: Alert and oriented. No focal neurological deficits. Skin: No rashes, lesions or ulcers Psychiatry: Judgement and insight appear normal. Mood & affect appropriate.    Data Reviewed: I have personally reviewed following labs and imaging studies Basic Metabolic Panel:  Recent Labs Lab 03/15/16 2045 03/16/16 0429 03/17/16 0627  NA 138 137 139  K 4.0 4.0 4.0  CL 105 106 107  CO2 21* 19* 20*  GLUCOSE 138* 97 108*  BUN <5* <5* 9  CREATININE 0.84 0.79 0.85  CALCIUM 9.3 8.5* 9.2   Liver Function Tests:  Recent Labs Lab 03/15/16 2045  AST 39  ALT 32  ALKPHOS 110  BILITOT 0.5  PROT 8.0  ALBUMIN 3.2*     Recent Labs Lab 03/15/16 2045 03/16/16 0429 03/17/16 0627 03/18/16 1154  WBC 17.9* 14.2* 17.4* 12.0*  HGB 13.8 12.2 12.3 12.7  HCT 40.9 36.7 37.4 38.4  MCV 71.8* 70.6* 71.4*  71.6*  PLT 387 415* 400 380    CBG: No results for input(s): GLUCAP in the last 168 hours.  Recent Results (from the past 240 hour(s))  Culture, blood (Routine X 2) w Reflex to ID Panel     Status: None (Preliminary result)   Collection Time: 03/16/16  1:29 AM  Result Value Ref Range Status   Specimen Description BLOOD LEFT WRIST  Final   Special Requests BOTTLES DRAWN AEROBIC ONLY  Final    Culture NO GROWTH 2 DAYS  Final   Report Status PENDING  Incomplete  Culture, blood (Routine X 2) w Reflex to ID Panel     Status: None (Preliminary result)   Collection Time: 03/16/16  1:32 AM  Result Value Ref Range Status   Specimen Description BLOOD LEFT HAND  Final   Special Requests BOTTLES DRAWN AEROBIC AND ANAEROBIC  Final   Culture NO GROWTH 2 DAYS  Final   Report Status PENDING  Incomplete  Culture, sputum-assessment     Status: None   Collection Time: 03/16/16  4:57 AM  Result Value Ref Range Status   Specimen Description EXPECTORATED SPUTUM  Final   Special Requests NONE  Final   Sputum evaluation   Final    THIS SPECIMEN IS ACCEPTABLE. RESPIRATORY CULTURE REPORT TO FOLLOW.   Report Status 03/16/2016 FINAL  Final  Culture, respiratory (NON-Expectorated)     Status: None   Collection Time: 03/16/16  4:57 AM  Result Value Ref Range Status   Specimen Description EXPECTORATED SPUTUM  Final   Special Requests NONE  Final   Gram Stain   Final    FEW WBC PRESENT,BOTH PMN AND MONONUCLEAR FEW SQUAMOUS EPITHELIAL CELLS PRESENT MODERATE GRAM POSITIVE COCCI IN PAIRS IN CHAINS FEW GRAM NEGATIVE RODS THIS SPECIMEN IS ACCEPTABLE FOR SPUTUM CULTURE Performed at [facility]    Culture   Final    NORMAL OROPHARYNGEAL FLORA Performed at [facility]    Report Status 03/18/2016 FINAL  Final  Gram stain     Status: None   Collection Time: 03/16/16  4:57 AM  Result Value Ref Range Status   Specimen Description EXPECTORATED SPUTUM  Final   Special Requests NONE  Final   Gram Stain   Final    FEW WBC PRESENT,BOTH PMN AND MONONUCLEAR MODERATE GRAM POSITIVE COCCI IN PAIRS FEW GRAM NEGATIVE RODS RARE GRAM POSITIVE RODS    Report Status 03/16/2016 FINAL  Final     Studies: No results found.  Scheduled Meds: . azithromycin  500 mg Intravenous Q24H  . benzonatate  200 mg Oral TID  . cefTRIAXone (ROCEPHIN)  IV  1 g Intravenous Q24H  . enoxaparin (LOVENOX)  injection  60 mg Subcutaneous Q24H  . guaiFENesin  600 mg Oral BID   Continuous Infusions:    Time spent: 25 min  Penny Pia  [facility] Pager [phone] If 7PM-7AM, please contact night-coverage at [email or web address], Office  [phone]  password [email or web address] 03/18/2016, 4:33 PM  LOS: 2 days

## 2016-03-19 LAB — TROPONIN I
Troponin I: <0.03 ng/mL (ref <0.031)
Troponin I: <0.03 ng/mL (ref <0.031)

## 2016-03-19 MED ORDER — HYDROCODONE-HOMATROPINE 5-1.5 MG/5ML PO SYRP
5.0000 mL | ORAL_SOLUTION | Freq: Four times a day (QID) | ORAL | Status: DC | PRN
  Administered 2016-03-21 – 2016-03-22 (×4): 5 mL via ORAL
  Filled 2016-03-19 (×6): qty 5

## 2016-03-19 NOTE — Progress Notes (Signed)
[facility]  PROGRESS NOTE  Kiara Leach ZOX:[id number] DOB: 11/01/1976 DOA: 03/15/2016 PCP: No primary care provider on file.  Outpatient specialist:  Brief HPI:  40 y.o. female with medical history significant of back pain; who presents with complaints of productive cough, wheezing, and shortness breath. Symptoms have been progressively worsening over the last 2 weeks. She expresses that she's had a productive cough with thick sputum production. Associated symptoms include a lipase, intermittent subjective fevers, nausea, vomiting, diarrhea, pleuritic back pain, and chest pain secondary to coughing. She reports utilizing Mucinex and Tylenol cough syrup without relief of symptoms. Patient endorses a remote history of tobacco use.  Principal Problem:   Sepsis (HCC) Active Problems:   CAP (community acquired pneumonia)   Nausea with vomiting   Sinus tachycardia (HCC)   Assessment/Plan: 1. Community-acquired pneumonia- chest x-ray shows left lingular pneumonia, urinary strep pneumo antigen negative. Continue ceftriaxone and Zithromax. WBC is trending down currently. Follow CBC in a.m. Continue when necessary DuoNeb every 4 hours. Continue supportive therapy with tessalon pearles  Still with significant cough spells as such will add hycodan. Pt's condition has improved with steroids which were added last night. I suspect the patient will require PFT after her pneumonia resolves.  2. Nausea vomiting diarrhea- likely gastroenteritis, improving continue Zofran when necessary.   DVT prophylaxis: Lovenox Code Status: Full code Family Communication: spoke with patient directly  Disposition Plan: Pending improvement in pneumonia   Consultants:  None  Procedures:  None  Antibiotics:  Ceftriaxone for 03/15/2016  Zithromax 03/15/2016  Subjective: Pt states her breathing condition has improved but reports that she is still coughing significantly.  Objective: Filed Vitals:   03/18/16 2042 03/19/16 0246 03/19/16 0514 03/19/16 1455  BP: 105/55 137/52 95/49 112/62  Pulse: 70 63 62 73  Temp: 98.3 F (36.8 C)  99.6 F (37.6 C) 98.5 F (36.9 C)  TempSrc: Oral  Oral Oral  Resp: 18  18 18   Height:      Weight:      SpO2: 98%  93%     Intake/Output Summary (Last 24 hours) at 03/19/16 1649 Last data filed at 03/19/16 1230  Gross per 24 hour  Intake   1010 ml  Output    700 ml  Net    310 ml   Filed Weights   03/15/16 1952 03/16/16 0310  Weight: 117.028 kg (258 lb) 117.482 kg (259 lb)    Examination:  General exam: Awake and alert, in nad Respiratory system: Bibasilar rhonchi Respiratory effort normal., Equal chest rise, no wheezes, positive rales at left lobe Cardiovascular system: S1 & S2 heard, RRR. No JVD, murmurs, rubs, gallops or clicks. No pedal edema. Gastrointestinal system: Abdomen is nondistended, soft and nontender. No organomegaly or masses felt. Normal bowel sounds heard. Central nervous system: Alert and oriented. No focal neurological deficits. Skin: No rashes, lesions or ulcers Psychiatry: Judgement and insight appear normal. Mood & affect appropriate.    Data Reviewed: I have personally reviewed following labs and imaging studies Basic Metabolic Panel:  Recent Labs Lab 03/15/16 2045 03/16/16 0429 03/17/16 0627  NA 138 137 139  K 4.0 4.0 4.0  CL 105 106 107  CO2 21* 19* 20*  GLUCOSE 138* 97 108*  BUN <5* <5* 9  CREATININE 0.84 0.79 0.85  CALCIUM 9.3 8.5* 9.2   Liver Function Tests:  Recent Labs Lab 03/15/16 2045  AST 39  ALT 32  ALKPHOS 110  BILITOT 0.5  PROT 8.0  ALBUMIN 3.2*  Recent Labs Lab 03/15/16 2045 03/16/16 0429 03/17/16 0627 03/18/16 1154 03/18/16 2240  WBC 17.9* 14.2* 17.4* 12.0* 11.8*  HGB 13.8 12.2 12.3 12.7 11.4*  HCT 40.9 36.7 37.4 38.4 35.5*  MCV 71.8* 70.6* 71.4* 71.6* 72.6*  PLT 387 415* 400 380 353    CBG: No results for input(s): GLUCAP in the last 168 hours.  Recent Results  (from the past 240 hour(s))  Culture, blood (Routine X 2) w Reflex to ID Panel     Status: None (Preliminary result)   Collection Time: 03/16/16  1:29 AM  Result Value Ref Range Status   Specimen Description BLOOD LEFT WRIST  Final   Special Requests BOTTLES DRAWN AEROBIC ONLY  Final   Culture NO GROWTH 3 DAYS  Final   Report Status PENDING  Incomplete  Culture, blood (Routine X 2) w Reflex to ID Panel     Status: None (Preliminary result)   Collection Time: 03/16/16  1:32 AM  Result Value Ref Range Status   Specimen Description BLOOD LEFT HAND  Final   Special Requests BOTTLES DRAWN AEROBIC AND ANAEROBIC  Final   Culture NO GROWTH 3 DAYS  Final   Report Status PENDING  Incomplete  Culture, sputum-assessment     Status: None   Collection Time: 03/16/16  4:57 AM  Result Value Ref Range Status   Specimen Description EXPECTORATED SPUTUM  Final   Special Requests NONE  Final   Sputum evaluation   Final    THIS SPECIMEN IS ACCEPTABLE. RESPIRATORY CULTURE REPORT TO FOLLOW.   Report Status 03/16/2016 FINAL  Final  Culture, respiratory (NON-Expectorated)     Status: None   Collection Time: 03/16/16  4:57 AM  Result Value Ref Range Status   Specimen Description EXPECTORATED SPUTUM  Final   Special Requests NONE  Final   Gram Stain   Final    FEW WBC PRESENT,BOTH PMN AND MONONUCLEAR FEW SQUAMOUS EPITHELIAL CELLS PRESENT MODERATE GRAM POSITIVE COCCI IN PAIRS IN CHAINS FEW GRAM NEGATIVE RODS THIS SPECIMEN IS ACCEPTABLE FOR SPUTUM CULTURE Performed at [facility]    Culture   Final    NORMAL OROPHARYNGEAL FLORA Performed at [facility]    Report Status 03/18/2016 FINAL  Final  Gram stain     Status: None   Collection Time: 03/16/16  4:57 AM  Result Value Ref Range Status   Specimen Description EXPECTORATED SPUTUM  Final   Special Requests NONE  Final   Gram Stain   Final    FEW WBC PRESENT,BOTH PMN AND MONONUCLEAR MODERATE GRAM POSITIVE COCCI IN PAIRS FEW  GRAM NEGATIVE RODS RARE GRAM POSITIVE RODS    Report Status 03/16/2016 FINAL  Final     Studies: Dg Chest Port 1 View  03/18/2016  CLINICAL DATA:  Respiratory distress EXAM: PORTABLE CHEST 1 VIEW COMPARISON:  03/15/2016 FINDINGS: The heart size and mediastinal contours are normal. There is no pleural effusion identified. Persistent lingular pneumonia is identified. Right lung is clear. IMPRESSION: 1. Persistent lingular pneumonia. Electronically Signed   By: Signa Kell M.D.   On: 03/18/2016 21:42    Scheduled Meds: . azithromycin  500 mg Intravenous Q24H  . benzonatate  200 mg Oral TID  . cefTRIAXone (ROCEPHIN)  IV  1 g Intravenous Q24H  . enoxaparin (LOVENOX) injection  60 mg Subcutaneous Q24H  . guaiFENesin  600 mg Oral BID  . methylPREDNISolone (SOLU-MEDROL) injection  40 mg Intravenous Q8H   Continuous Infusions:    Time  spent: 25 min  Penny Pia  [facility] Pager [phone] If 7PM-7AM, please contact night-coverage at [email or web address], Office  [phone]  password [email or web address] 03/19/2016, 4:49 PM  LOS: 3 days

## 2016-03-19 NOTE — Progress Notes (Signed)
Pt coughing continuously. Paged Dr Cena BentonVega, d/t PRN's already administered. Dr Cena BentonVega said he will order something to help. Will administer as soon as available. Will continue to monitor.

## 2016-03-20 NOTE — Progress Notes (Signed)
[facility]  PROGRESS NOTE  Kiara Leach ZOX:[id number] DOB: 09/22/1976 DOA: 03/15/2016 PCP: No primary care provider on file.  Outpatient specialist:  Brief HPI:  40 y.o. female with medical history significant of back pain; who presents with complaints of productive cough, wheezing, and shortness breath. Symptoms have been progressively worsening over the last 2 weeks. She expresses that she's had a productive cough with thick sputum production. Associated symptoms include a lipase, intermittent subjective fevers, nausea, vomiting, diarrhea, pleuritic back pain, and chest pain secondary to coughing. She reports utilizing Mucinex and Tylenol cough syrup without relief of symptoms. Patient endorses a remote history of tobacco use.  Principal Problem:   Sepsis (HCC) Active Problems:   CAP (community acquired pneumonia)   Nausea with vomiting   Sinus tachycardia (HCC)   Assessment/Plan: 1. Community-acquired pneumonia- chest x-ray shows left lingular pneumonia, urinary strep pneumo antigen negative. Continue ceftriaxone and Zithromax. WBC is trending down currently. Follow CBC in a.m. Continue when necessary DuoNeb every 4 hours. Continue supportive therapy with tessalon pearles  Still with significant cough spells as such will add hycodan. Pt's condition has improved with steroids which were added last night. I suspect the patient will require PFT after her pneumonia resolves. Still with cough but improving slowly. 2. Nausea vomiting diarrhea- likely gastroenteritis, improving continue Zofran when necessary.   DVT prophylaxis: Lovenox Code Status: Full code Family Communication: spoke with patient directly  Disposition Plan: Pending improvement in pneumonia   Consultants:  None  Procedures:  None  Antibiotics:  Ceftriaxone for 03/15/2016  Zithromax 03/15/2016  Subjective: Pt states her breathing has improved.   Objective: Filed Vitals:   03/19/16 0514 03/19/16 1455  03/19/16 2111 03/20/16 0531  BP: 95/49 112/62 105/64 97/54  Pulse: 62 73 79 57  Temp: 99.6 F (37.6 C) 98.5 F (36.9 C) 98.3 F (36.8 C) 97.7 F (36.5 C)  TempSrc: Oral Oral Oral Oral  Resp: 18 18 16 18   Height:      Weight:      SpO2: 93%  99% 98%    Intake/Output Summary (Last 24 hours) at 03/20/16 1626 Last data filed at 03/20/16 8119  Gross per 24 hour  Intake    240 ml  Output      0 ml  Net    240 ml   Filed Weights   03/15/16 1952 03/16/16 0310  Weight: 117.028 kg (258 lb) 117.482 kg (259 lb)    Examination:  General exam: Awake and alert, in nad Respiratory system: Bibasilar rhonchi : mild wheeze, rhales Cardiovascular system: S1 & S2 heard, RRR. No JVD, murmurs, rubs, gallops or clicks. No pedal edema. Gastrointestinal system: Abdomen is nondistended, soft and nontender. No organomegaly or masses felt. Normal bowel sounds heard. Central nervous system: Alert and oriented. No focal neurological deficits. Skin: No rashes, lesions or ulcers Psychiatry: Judgement and insight appear normal. Mood & affect appropriate.    Data Reviewed: I have personally reviewed following labs and imaging studies Basic Metabolic Panel:  Recent Labs Lab 03/15/16 2045 03/16/16 0429 03/17/16 0627  NA 138 137 139  K 4.0 4.0 4.0  CL 105 106 107  CO2 21* 19* 20*  GLUCOSE 138* 97 108*  BUN <5* <5* 9  CREATININE 0.84 0.79 0.85  CALCIUM 9.3 8.5* 9.2   Liver Function Tests:  Recent Labs Lab 03/15/16 2045  AST 39  ALT 32  ALKPHOS 110  BILITOT 0.5  PROT 8.0  ALBUMIN 3.2*     Recent Labs Lab  03/15/16 2045 03/16/16 0429 03/17/16 0627 03/18/16 1154 03/18/16 2240  WBC 17.9* 14.2* 17.4* 12.0* 11.8*  HGB 13.8 12.2 12.3 12.7 11.4*  HCT 40.9 36.7 37.4 38.4 35.5*  MCV 71.8* 70.6* 71.4* 71.6* 72.6*  PLT 387 415* 400 380 353    CBG: No results for input(s): GLUCAP in the last 168 hours.  Recent Results (from the past 240 hour(s))  Culture, blood (Routine X 2) w  Reflex to ID Panel     Status: None (Preliminary result)   Collection Time: 03/16/16  1:29 AM  Result Value Ref Range Status   Specimen Description BLOOD LEFT WRIST  Final   Special Requests BOTTLES DRAWN AEROBIC ONLY  Final   Culture NO GROWTH 4 DAYS  Final   Report Status PENDING  Incomplete  Culture, blood (Routine X 2) w Reflex to ID Panel     Status: None (Preliminary result)   Collection Time: 03/16/16  1:32 AM  Result Value Ref Range Status   Specimen Description BLOOD LEFT HAND  Final   Special Requests BOTTLES DRAWN AEROBIC AND ANAEROBIC  Final   Culture NO GROWTH 4 DAYS  Final   Report Status PENDING  Incomplete  Culture, sputum-assessment     Status: None   Collection Time: 03/16/16  4:57 AM  Result Value Ref Range Status   Specimen Description EXPECTORATED SPUTUM  Final   Special Requests NONE  Final   Sputum evaluation   Final    THIS SPECIMEN IS ACCEPTABLE. RESPIRATORY CULTURE REPORT TO FOLLOW.   Report Status 03/16/2016 FINAL  Final  Culture, respiratory (NON-Expectorated)     Status: None   Collection Time: 03/16/16  4:57 AM  Result Value Ref Range Status   Specimen Description EXPECTORATED SPUTUM  Final   Special Requests NONE  Final   Gram Stain   Final    FEW WBC PRESENT,BOTH PMN AND MONONUCLEAR FEW SQUAMOUS EPITHELIAL CELLS PRESENT MODERATE GRAM POSITIVE COCCI IN PAIRS IN CHAINS FEW GRAM NEGATIVE RODS THIS SPECIMEN IS ACCEPTABLE FOR SPUTUM CULTURE Performed at [facility]    Culture   Final    NORMAL OROPHARYNGEAL FLORA Performed at [facility]    Report Status 03/18/2016 FINAL  Final  Gram stain     Status: None   Collection Time: 03/16/16  4:57 AM  Result Value Ref Range Status   Specimen Description EXPECTORATED SPUTUM  Final   Special Requests NONE  Final   Gram Stain   Final    FEW WBC PRESENT,BOTH PMN AND MONONUCLEAR MODERATE GRAM POSITIVE COCCI IN PAIRS FEW GRAM NEGATIVE RODS RARE GRAM POSITIVE RODS    Report  Status 03/16/2016 FINAL  Final     Studies: Dg Chest Port 1 View  03/18/2016  CLINICAL DATA:  Respiratory distress EXAM: PORTABLE CHEST 1 VIEW COMPARISON:  03/15/2016 FINDINGS: The heart size and mediastinal contours are normal. There is no pleural effusion identified. Persistent lingular pneumonia is identified. Right lung is clear. IMPRESSION: 1. Persistent lingular pneumonia. Electronically Signed   By: Signa Kell M.D.   On: 03/18/2016 21:42    Scheduled Meds: . azithromycin  500 mg Intravenous Q24H  . benzonatate  200 mg Oral TID  . cefTRIAXone (ROCEPHIN)  IV  1 g Intravenous Q24H  . enoxaparin (LOVENOX) injection  60 mg Subcutaneous Q24H  . guaiFENesin  600 mg Oral BID  . methylPREDNISolone (SOLU-MEDROL) injection  40 mg Intravenous Q8H   Continuous Infusions:    Time spent: 25 min  Penny PiaVEGA, Syna Gad  [facility] Pager [phone] If 7PM-7AM, please contact night-coverage at [email or web address], Office  [phone]  password [email or web address] 03/20/2016, 4:26 PM  LOS: 4 days

## 2016-03-21 LAB — CULTURE, BLOOD (ROUTINE X 2)
CULTURE: NO GROWTH
CULTURE: NO GROWTH

## 2016-03-21 MED ORDER — TIOTROPIUM BROMIDE MONOHYDRATE 18 MCG IN CAPS
18.0000 ug | ORAL_CAPSULE | Freq: Every day | RESPIRATORY_TRACT | Status: DC
Start: 2016-03-21 — End: 2016-03-23
  Administered 2016-03-21 – 2016-03-23 (×3): 18 ug via RESPIRATORY_TRACT
  Filled 2016-03-21: qty 5

## 2016-03-21 MED ORDER — AZITHROMYCIN 500 MG PO TABS
500.0000 mg | ORAL_TABLET | Freq: Every day | ORAL | Status: DC
  Administered 2016-03-21 – 2016-03-23 (×3): 500 mg via ORAL
  Filled 2016-03-21 (×3): qty 1

## 2016-03-21 NOTE — Progress Notes (Signed)
[facility]  PROGRESS NOTE  Kiara Leach NUU:[id number] DOB: [birth year] DOA: 03/15/2016 PCP: No primary care provider on file.  Outpatient specialist:  Brief HPI:  40 y.o. female with medical history significant of back pain; who presents with complaints of productive cough, wheezing, and shortness breath. Symptoms have been progressively worsening over the last 2 weeks. She expresses that she's had a productive cough with thick sputum production. Associated symptoms include a lipase, intermittent subjective fevers, nausea, vomiting, diarrhea, pleuritic back pain, and chest pain secondary to coughing. She reports utilizing Mucinex and Tylenol cough syrup without relief of symptoms. Patient endorses a remote history of tobacco use.  Principal Problem:   Sepsis (HCC) Active Problems:   CAP (community acquired pneumonia)   Nausea with vomiting   Sinus tachycardia (HCC)   Assessment/Plan: 1. Community-acquired pneumonia- chest x-ray shows left lingular pneumonia, urinary strep pneumo antigen negative. Continue ceftriaxone and Zithromax. WBC is trending down currently. Follow CBC in a.m. Continue when necessary DuoNeb every 4 hours. Continue supportive therapy with tessalon pearles  Still with significant cough spells as such will add hycodan. Pt's condition has improved with steroids which were added last night. I suspect the patient will require PFT after her pneumonia resolves. given slow improvement will try trial of Spiriva 2. Nausea vomiting diarrhea- resolved.   DVT prophylaxis: Lovenox Code Status: Full code Family Communication: spoke with patient directly  Disposition Plan: Pending improvement in pneumonia   Consultants:  None  Procedures:  None  Antibiotics:  Ceftriaxone for 03/15/2016  Zithromax 03/15/2016  Subjective: Pt frustrated that her improvement is slow. She reports as a child she had allergies but does not distinctly remember having trouble with wheezes  intermittently.  Objective: Filed Vitals:   03/20/16 2234 03/21/16 0533 03/21/16 1325 03/21/16 1416  BP: 99/67 91/59 112/62   Pulse: 62 58 55   Temp: 98.1 F (36.7 C) 98 F (36.7 C) 97.6 F (36.4 C)   TempSrc:  Oral Oral   Resp: Height:      Weight:      SpO2: 98% 95% 99% 94%    Intake/Output Summary (Last 24 hours) at 03/21/16 1503 Last data filed at 03/20/16 1641  Gross per 24 hour  Intake    550 ml  Output      0 ml  Net    550 ml   Filed Weights   03/15/16 1952 03/16/16 0310  Weight: 117.028 kg (258 lb) 117.482 kg (259 lb)    Examination:  General exam: Awake and alert, in nad Respiratory system: Bibasilar rhonchi : No wheezes, poor inspiratory effort Cardiovascular system: S1 & S2 heard, RRR. No JVD, murmurs, rubs, gallops or clicks. No pedal edema. Gastrointestinal system: Abdomen is nondistended, soft and nontender. No organomegaly or masses felt. Normal bowel sounds heard. Central nervous system: Alert and oriented. No focal neurological deficits. Skin: No rashes, lesions or ulcers Psychiatry: Judgement and insight appear normal. Mood & affect appropriate.    Data Reviewed: I have personally reviewed following labs and imaging studies Basic Metabolic Panel:  Recent Labs Lab 03/15/16 2045 03/16/16 0429 03/17/16 0627  NA 138 137 139  K 4.0 4.0 4.0  CL 105 106 107  CO2 21* 19* 20*  GLUCOSE 138* 97 108*  BUN <5* <5* 9  CREATININE 0.84 0.79 0.85  CALCIUM 9.3 8.5* 9.2   Liver Function Tests:  Recent Labs Lab Kiara Leach 2045  AST 39  ALT 32  ALKPHOS 110  BILITOT 0.5  PROT 8.0  ALBUMIN 3.2*     Recent Labs Lab 03/15/16 2045 03/16/16 0429 03/17/16 0627 03/18/16 1154 03/18/16 2240  WBC 17.9* 14.2* 17.4* 12.0* 11.8*  HGB 13.8 12.2 12.3 12.7 11.4*  HCT 40.9 36.7 37.4 38.4 35.5*  MCV 71.8* 70.6* 71.4* 71.6* 72.6*  PLT 387 415* 400 380 353    CBG: No results for input(s): GLUCAP in the last 168 hours.  Recent Results (from  the past 240 hour(s))  Culture, blood (Routine X 2) w Reflex to ID Panel     Status: None   Collection Time: 03/16/16  1:29 AM  Result Value Ref Range Status   Specimen Description BLOOD LEFT WRIST  Final   Special Requests BOTTLES DRAWN AEROBIC ONLY 3ML  Final   Culture NO GROWTH 5 DAYS  Final   Report Status 03/21/2016 FINAL  Final  Culture, blood (Routine X 2) w Reflex to ID Panel     Status: None   Collection Time: 03/16/16  1:32 AM  Result Value Ref Range Status   Specimen Description BLOOD LEFT HAND  Final   Special Requests BOTTLES DRAWN AEROBIC AND ANAEROBIC 5ML  Final   Culture NO GROWTH 5 DAYS  Final   Report Status 03/21/2016 FINAL  Final  Culture, sputum-assessment     Status: None   Collection Time: 03/16/16  4:57 AM  Result Value Ref Range Status   Specimen Description EXPECTORATED SPUTUM  Final   Special Requests NONE  Final   Sputum evaluation   Final    THIS SPECIMEN IS ACCEPTABLE. RESPIRATORY CULTURE REPORT TO FOLLOW.   Report Status 03/16/2016 FINAL  Final  Culture, respiratory (NON-Expectorated)     Status: None   Collection Time: 03/16/16  4:57 AM  Result Value Ref Range Status   Specimen Description EXPECTORATED SPUTUM  Final   Special Requests NONE  Final   Gram Stain   Final    FEW WBC PRESENT,BOTH PMN AND MONONUCLEAR FEW SQUAMOUS EPITHELIAL CELLS PRESENT MODERATE GRAM POSITIVE COCCI IN PAIRS IN CHAINS FEW GRAM NEGATIVE RODS THIS SPECIMEN IS ACCEPTABLE FOR SPUTUM CULTURE [facility]    Culture   Final    NORMAL OROPHARYNGEAL FLORA [facility]formed at [facility]    Report Status 03/18/2016 FINAL  Final  Gram stain     Status: None   Collection Time: 03/16/16  4:57 AM  Result Value Ref Range Status   Specimen Description EXPECTORATED SPUTUM  Final   Special Requests NONE  Final   Gram Stain   Final    FEW WBC PRESENT,BOTH PMN AND MONONUCLEAR MODERATE GRAM POSITIVE COCCI IN PAIRS FEW GRAM NEGATIVE RODS RARE GRAM POSITIVE  RODS    Report Status 03/16/2016 FINAL  Final     Studies: No results found.  Scheduled Meds: . azithromycin  500 mg Oral Daily  . benzonatate  200 mg Oral TID  . cefTRIAXone (ROCEPHIN)  IV  1 g Intravenous Q24H  . enoxaparin (LOVENOX) injection  60 mg Subcutaneous Q24H  . guaiFENesin  600 mg Oral BID  . methylPREDNISolone (SOLU-MEDROL) injection  40 mg Intravenous Q8H  . tiotropium  18 mcg Inhalation Daily   Continuous Infusions:    Time spent: 25 min  Penny PiaVEGA, Dodie Parisi  [facility] Pager [phone] If 7PM-7AM, please contact night-coverage at [email or web address], Office  [phone]  password [email or web address] 03/21/2016, 3:03 PM  LOS: 5 days

## 2016-03-22 MED ORDER — METHYLPREDNISOLONE SODIUM SUCC 40 MG IJ SOLR: 40.0000 mg | INTRAMUSCULAR | Status: DC

## 2016-03-22 NOTE — Progress Notes (Signed)
PROGRESS NOTE  Collier Flowersisha Marulanda ZOX:[id number] DOB: 01/12/1976 DOA: 03/15/2016 PCP: No primary care provider on file.  HPI/Recap of past 24 hours: 40 y.o. female with medical history significant of back pain; who presents with complaints of productive cough, wheezing, and shortness breath. Symptoms have been progressively worsening over the last 2 weeks. She expresses that she's had a productive cough with thick sputum production. Associated symptoms include a lipase, intermittent subjective fevers, nausea, vomiting, diarrhea, pleuritic back pain, and chest pain secondary to coughing. She reports utilizing Mucinex and Tylenol cough syrup without relief of symptoms. Patient endorses a remote history of tobacco use.  Over the last 24 hours she has noticed some improvement with spiriva. She has been on room air since last night but still with significant coughing.  Assessment/Plan: Principal Problem:   Sepsis (HCC) Active Problems:   CAP (community acquired pneumonia)   Nausea with vomiting   Sinus tachycardia (HCC)  1. Community-acquired pneumonia- chest x-ray shows left lingular pneumonia, urinary strep pneumo antigen negative. Continue ceftriaxone and Zithromax. WBC is trending down currently. Follow CBC in a.m. Continue when necessary DuoNeb every 4 hours. Continue supportive therapy with tessalon pearles Still with significant cough spells as such will add hycodan. Pt's condition has improved with steroids which were added last night. I suspect the patient will require PFT after her pneumonia resolves. Cont Spiriva. Cut back on IV solumedrol today.  2. Nausea vomiting diarrhea- resolved.  Code Status: Full   Family Communication: To pt directly.   Disposition Plan: Likely home on room air with no needs in AM.    Consultants:  None   Procedures:  None   Antimicrobials:  Rocephin 4/26-  Azithromycin since 4/26.    Objective: Filed Vitals:   03/21/16 1416 03/21/16 2118  03/22/16 0521 03/22/16 0925  BP:  103/56 96/57   Pulse:  67 56 78  Temp:  98.3 F (36.8 C) 98.3 F (36.8 C)   TempSrc:  Oral    Resp:  16 16 18   Height:      Weight:      SpO2: 94% 95% 97% 94%    Intake/Output Summary (Last 24 hours) at 03/22/16 1421 Last data filed at 03/21/16 2100  Gross per 24 hour  Intake    240 ml  Output      0 ml  Net    240 ml   Filed Weights   03/15/16 1952 03/16/16 0310  Weight: 117.028 kg (258 lb) 117.482 kg (259 lb)    Exam: General exam: Awake and alert, in nad. Intermittent cough. Respiratory system: Bibasilar rhonchi,  Minimal wheeze, poor inspiratory effort Cardiovascular system: S1 & S2 heard, RRR. No JVD, murmurs, rubs, gallops or clicks. No pedal edema. Gastrointestinal system: Abdomen is nondistended, soft and nontender. No organomegaly or masses felt. Normal bowel sounds heard. Central nervous system: Alert and oriented. No focal neurological deficits. Skin: No rashes, lesions or ulcers Psychiatry: Judgement and insight appear normal. Mood & affect appropriate.    Data Reviewed: CBC:  Recent Labs Lab 03/15/16 2045 03/16/16 0429 03/17/16 0627 03/18/16 1154 03/18/16 2240  WBC 17.9* 14.2* 17.4* 12.0* 11.8*  HGB 13.8 12.2 12.3 12.7 11.4*  HCT 40.9 36.7 37.4 38.4 35.5*  MCV 71.8* 70.6* 71.4* 71.6* 72.6*  PLT 387 415* 400 380 353   Basic Metabolic Panel:  Recent Labs Lab 03/15/16 2045 03/16/16 0429 03/17/16 0627  NA 138 137 139  K 4.0 4.0 4.0  CL 105 106 107  CO2 21*  19* 20*  GLUCOSE 138* 97 108*  BUN <5* <5* 9  CREATININE 0.84 0.79 0.85  CALCIUM 9.3 8.5* 9.2   GFR: Estimated Creatinine Clearance: 108.9 mL/min (by C-G formula based on Cr of 0.85). Liver Function Tests:  Recent Labs Lab 03/15/16 2045  AST 39  ALT 32  ALKPHOS 110  BILITOT 0.5  PROT 8.0  ALBUMIN 3.2*   No results for input(s): LIPASE, AMYLASE in the last 168 hours. No results for input(s): AMMONIA in the last 168 hours. Coagulation  Profile: No results for input(s): INR, PROTIME in the last 168 hours. Cardiac Enzymes:  Recent Labs Lab 03/18/16 2240 03/19/16 0444 03/19/16 0925  TROPONINI <0.03 <0.03 <0.03   BNP (last 3 results) No results for input(s): PROBNP in the last 8760 hours. HbA1C: No results for input(s): HGBA1C in the last 72 hours. CBG: No results for input(s): GLUCAP in the last 168 hours. Lipid Profile: No results for input(s): CHOL, HDL, LDLCALC, TRIG, CHOLHDL, LDLDIRECT in the last 72 hours. Thyroid Function Tests: No results for input(s): TSH, T4TOTAL, FREET4, T3FREE, THYROIDAB in the last 72 hours. Anemia Panel: No results for input(s): VITAMINB12, FOLATE, FERRITIN, TIBC, IRON, RETICCTPCT in the last 72 hours. Urine analysis:    Component Value Date/Time   COLORURINE YELLOW 03/15/2016 2043   APPEARANCEUR CLEAR 03/15/2016 2043   LABSPEC 1.013 03/15/2016 2043   PHURINE 6.5 03/15/2016 2043   GLUCOSEU NEGATIVE 03/15/2016 2043   HGBUR SMALL* 03/15/2016 2043   BILIRUBINUR NEGATIVE 03/15/2016 2043   KETONESUR 15* 03/15/2016 2043   PROTEINUR NEGATIVE 03/15/2016 2043   NITRITE NEGATIVE 03/15/2016 2043   LEUKOCYTESUR NEGATIVE 03/15/2016 2043   Sepsis Labs: @LABRCNTIP (procalcitonin:4,lacticidven:4)  ) Recent Results (from the past 240 hour(s))  Culture, blood (Routine X 2) w Reflex to ID Panel     Status: None   Collection Time: 03/16/16  1:29 AM  Result Value Ref Range Status   Specimen Description BLOOD LEFT WRIST  Final   Special Requests BOTTLES DRAWN AEROBIC ONLY  Final   Culture NO GROWTH 5 DAYS  Final   Report Status 03/21/2016 FINAL  Final  Culture, blood (Routine X 2) w Reflex to ID Panel     Status: None   Collection Time: 03/16/16  1:32 AM  Result Value Ref Range Status   Specimen Description BLOOD LEFT HAND  Final   Special Requests BOTTLES DRAWN AEROBIC AND ANAEROBIC  Final   Culture NO GROWTH 5 DAYS  Final   Report Status 03/21/2016 FINAL  Final  Culture,  sputum-assessment     Status: None   Collection Time: 03/16/16  4:57 AM  Result Value Ref Range Status   Specimen Description EXPECTORATED SPUTUM  Final   Special Requests NONE  Final   Sputum evaluation   Final    THIS SPECIMEN IS ACCEPTABLE. RESPIRATORY CULTURE REPORT TO FOLLOW.   Report Status 03/16/2016 FINAL  Final  Culture, respiratory (NON-Expectorated)     Status: None   Collection Time: 03/16/16  4:57 AM  Result Value Ref Range Status   Specimen Description EXPECTORATED SPUTUM  Final   Special Requests NONE  Final   Gram Stain   Final    FEW WBC PRESENT,BOTH PMN AND MONONUCLEAR FEW SQUAMOUS EPITHELIAL CELLS PRESENT MODERATE GRAM POSITIVE COCCI IN PAIRS IN CHAINS FEW GRAM NEGATIVE RODS THIS SPECIMEN IS ACCEPTABLE FOR SPUTUM CULTURE Performed at [facility]    Culture   Final    NORMAL OROPHARYNGEAL FLORA Performed at [facility]    Report Status 03/18/2016 FINAL  Final  Gram stain     Status: None   Collection Time: 03/16/16  4:57 AM  Result Value Ref Range Status   Specimen Description EXPECTORATED SPUTUM  Final   Special Requests NONE  Final   Gram Stain   Final    FEW WBC PRESENT,BOTH PMN AND MONONUCLEAR MODERATE GRAM POSITIVE COCCI IN PAIRS FEW GRAM NEGATIVE RODS RARE GRAM POSITIVE RODS    Report Status 03/16/2016 FINAL  Final      Studies: No results found.  Scheduled Meds: . azithromycin  500 mg Oral Daily  . benzonatate  200 mg Oral TID  . enoxaparin (LOVENOX) injection  60 mg Subcutaneous Q24H  . guaiFENesin  600 mg Oral BID  . methylPREDNISolone (SOLU-MEDROL) injection  40 mg Intravenous Q8H  . tiotropium  18 mcg Inhalation Daily    Continuous Infusions:    LOS: 6 days   Time spent: 24  Mir Vergie Living, MD [facility] Pager [phone]  If 7PM-7AM, please contact night-coverage [email or web address] Password [email or web address] 03/22/2016, 2:21 PM

## 2016-03-23 ENCOUNTER — Inpatient Hospital Stay (HOSPITAL_COMMUNITY): Payer: Medicaid Other

## 2016-03-23 DIAGNOSIS — R0989 Other specified symptoms and signs involving the circulatory and respiratory systems: Secondary | ICD-10-CM

## 2016-03-23 MED ORDER — METHYLPREDNISOLONE 4 MG PO TBPK: ORAL_TABLET | ORAL | Status: DC

## 2016-03-23 MED ORDER — GUAIFENESIN ER 600 MG PO TB12: 600.0000 mg | ORAL_TABLET | Freq: Two times a day (BID) | ORAL | Status: DC

## 2016-03-23 MED ORDER — BENZONATATE 200 MG PO CAPS: 200.0000 mg | ORAL_CAPSULE | Freq: Three times a day (TID) | ORAL | Status: DC

## 2016-03-23 MED ORDER — TIOTROPIUM BROMIDE MONOHYDRATE 18 MCG IN CAPS: 18.0000 ug | ORAL_CAPSULE | Freq: Every day | RESPIRATORY_TRACT | Status: DC

## 2016-03-23 MED ORDER — HYDROCODONE-ACETAMINOPHEN 5-325 MG PO TABS: 1.0000 | ORAL_TABLET | ORAL | Status: DC | PRN

## 2016-03-23 MED ORDER — AZITHROMYCIN 500 MG PO TABS: 500.0000 mg | ORAL_TABLET | Freq: Every day | ORAL | Status: DC

## 2016-03-23 NOTE — Care Management Note (Signed)
Case Management Note  Patient Details  Name: Kiara Leach MRN: [id number] Date of Birth: 10/01/1976  Subjective/Objective:                  Spoke to patient at the bedside. She states that she moved to [place] from [place] about a year ago and lives in [place] with her niece. She states her niece drives, but she does not. She is covered with Medicaid, and states that her medical group is "[facility]" on her card. CM advised patient to go to DSS to get MD on medicaid card after discharge. Patient stated understanding and knew where to go. Patient denies any HH or medication needs.      Action/Plan:  DC to home, self care.  Expected Discharge Date:                  Expected Discharge Plan:  Home/Self Care (lives with neice)  In-House Referral:     Discharge planning Services  CM Consult  Post Acute Care Choice:    Choice offered to:     DME Arranged:    DME Agency:     HH Arranged:    HH Agency:     Status of Service:  Completed, signed off  Medicare Important Message Given:    Date Medicare IM Given:    Medicare IM give by:    Date Additional Medicare IM Given:    Additional Medicare Important Message give by:     If discussed at Long Length of Stay Meetings, dates discussed:    Additional Comments:  Lawerance SabalDebbie Kenndra Morris, RN 03/23/2016, 1:09 PM

## 2016-03-23 NOTE — Progress Notes (Signed)
Nsg Discharge Note  Admit Date:  03/15/2016 Discharge date: 03/23/2016   Collier Flowersisha Mathew to be D/C'd Home per MD order.  AVS completed.  Copy for chart, and copy for patient signed, and dated. Patient/caregiver able to verbalize understanding.  Discharge Medication:   Medication List    TAKE these medications        azithromycin 500 MG tablet  Commonly known as:  ZITHROMAX  Take 1 tablet (500 mg total) by mouth daily.     benzonatate 200 MG capsule  Commonly known as:  TESSALON  Take 1 capsule (200 mg total) by mouth 3 (three) times daily.     EXCEDRIN EXTRA STRENGTH 250-250-65 MG tablet  Generic drug:  aspirin-acetaminophen-caffeine  Take 3 tablets by mouth every 6 (six) hours as needed (back pain).     guaiFENesin 600 MG 12 hr tablet  Commonly known as:  MUCINEX  Take 1 tablet (600 mg total) by mouth 2 (two) times daily.     HYDROcodone-acetaminophen 5-325 MG tablet  Commonly known as:  NORCO/VICODIN  Take 1 tablet by mouth every 4 (four) hours as needed for moderate pain.     methylPREDNISolone 4 MG Tbpk tablet  Commonly known as:  MEDROL DOSEPAK  Per dose pack instructions, 6 day taper.     ondansetron 4 MG tablet  Commonly known as:  ZOFRAN  Take 1 tablet (4 mg total) by mouth every 6 (six) hours.     tiotropium 18 MCG inhalation capsule  Commonly known as:  SPIRIVA  Place 1 capsule (18 mcg total) into inhaler and inhale daily.        Discharge Assessment: Filed Vitals:   03/22/16 2115 03/23/16 0605  BP: 115/60 94/51  Pulse: 59 57  Temp: 98.3 F (36.8 C) 98.1 F (36.7 C)  Resp: 18 18   Skin clean, dry and intact without evidence of skin break down, no evidence of skin tears noted. IV catheter discontinued intact. Site without signs and symptoms of complications - no redness or edema noted at insertion site, patient denies c/o pain - only slight tenderness at site.  Dressing with slight pressure applied.  D/c Instructions-Education: Discharge instructions  given to patient/family with verbalized understanding. D/c education completed with patient/family including follow up instructions, medication list, d/c activities limitations if indicated, with other d/c instructions as indicated by MD - patient able to verbalize understanding, all questions fully answered. Patient instructed to return to ED, call 911, or call MD for any changes in condition.  Patient escorted via WC, and D/C home via private auto.  Camillo FlamingVicki L Anissia Wessells, RN 03/23/2016 12:29 PM

## 2016-03-23 NOTE — Discharge Summary (Addendum)
Discharge Summary  Kiara Leach OZH:[id number] DOB: 1976/05/12  PCP: No primary care provider on file.  Admit date: 03/15/2016 Discharge date: 03/23/2016  Time spent: 32   Recommendations for Outpatient Follow-up:  1. PCP in 2-3 weeks   Discharge Diagnoses:  Active Hospital Problems   Diagnosis Date Noted  . Sepsis (HCC) 03/16/2016  . CAP (community acquired pneumonia) 03/16/2016  . Nausea with vomiting 03/16/2016  . Sinus tachycardia (HCC) 03/16/2016    Resolved Hospital Problems   Diagnosis Date Noted Date Resolved  No resolved problems to display.  Morbid Obesity   Discharge Condition: Stable   Diet recommendation: Regular   Filed Vitals:   03/22/16 2115 03/23/16 0605  BP: 115/60 94/51  Pulse: 59 57  Temp: 98.3 F (36.8 C) 98.1 F (36.7 C)  Resp: 18 18    History of present illness:  40 y.o. female with medical history significant of back pain; who presents with complaints of productive cough, wheezing, and shortness breath. Symptoms have been progressively worsening over the last 2 weeks. She expresses that she's had a productive cough with thick sputum production. Associated symptoms include a lipase, intermittent subjective fevers, nausea, vomiting, diarrhea, pleuritic back pain, and chest pain secondary to coughing. She reports utilizing Mucinex and Tylenol cough syrup without relief of symptoms. Patient endorses a remote history of tobacco use.  Over the last 24 hours she has noticed some improvement with spiriva. She has been on room air since last night but still with significant coughing.   Hospital Course:  Principal Problem:   Sepsis (HCC) Active Problems:   CAP (community acquired pneumonia)   Nausea with vomiting   Sinus tachycardia (HCC)  1. Community-acquired pneumonia- chest x-ray shows left lingular pneumonia, urinary strep pneumo antigen negative. Continue ceftriaxone and Zithromax. WBC is trending down currently. Follow CBC in a.m. Continue  when necessary DuoNeb every 4 hours. Continue supportive therapy with tessalon pearles Still with significant cough spells as such will add hycodan. Pt's condition has improved with steroids which were added last night. I suspect the patient will require PFT after her pneumonia resolves. Cont Spiriva. Cut back on IV solumedrol today.  Today she is improved, still on room air. Nonproductive cough but feels ready for d/c from the hospital today.   Procedures:  None   Consultations:  None   Discharge Exam: BP 94/51 mmHg  Pulse 57  Temp(Src) 98.1 F (36.7 C) (Oral)  Resp 18  Ht 5\' 3"  (1.6 m)  Wt 117.482 kg (259 lb)  BMI 45.89 kg/m2  SpO2 94%  LMP 02/20/2016 General exam: Awake and alert, in nad. Intermittent cough. Respiratory system: Bibasilar rhonchi, Minimal wheeze, poor inspiratory effort. Significant cough, dry. Cardiovascular system: S1 & S2 heard, RRR. No JVD, murmurs, rubs, gallops or clicks. No pedal edema. Gastrointestinal system: Abdomen is nondistended, soft and nontender. No organomegaly or masses felt. Normal bowel sounds heard. Central nervous system: Alert and oriented. No focal neurological deficits. Skin: No rashes, lesions or ulcers Psychiatry: Judgement and insight appear normal. Mood & affect appropriate.   Discharge Instructions You were cared for by a hospitalist during your hospital stay. If you have any questions about your discharge medications or the care you received while you were in the hospital after you are discharged, you can call the unit and asked to speak with the hospitalist on call if the hospitalist that took care of you is not available. Once you are discharged, your primary care physician will handle any further medical issues.  Please note that NO REFILLS for any discharge medications will be authorized once you are discharged, as it is imperative that you return to your primary care physician (or establish a relationship with a primary care  physician if you do not have one) for your aftercare needs so that they can reassess your need for medications and monitor your lab values.     Medication List    TAKE these medications        azithromycin 500 MG tablet  Commonly known as:  ZITHROMAX  Take 1 tablet (500 mg total) by mouth daily.     benzonatate 200 MG capsule  Commonly known as:  TESSALON  Take 1 capsule (200 mg total) by mouth 3 (three) times daily.     EXCEDRIN EXTRA STRENGTH 250-250-65 MG tablet  Generic drug:  aspirin-acetaminophen-caffeine  Take 3 tablets by mouth every 6 (six) hours as needed (back pain).     guaiFENesin 600 MG 12 hr tablet  Commonly known as:  MUCINEX  Take 1 tablet (600 mg total) by mouth 2 (two) times daily.     HYDROcodone-acetaminophen 5-325 MG tablet  Commonly known as:  NORCO/VICODIN  Take 1 tablet by mouth every 4 (four) hours as needed for moderate pain.     methylPREDNISolone 4 MG Tbpk tablet  Commonly known as:  MEDROL DOSEPAK  Per dose pack instructions, 6 day taper.     ondansetron 4 MG tablet  Commonly known as:  ZOFRAN  Take 1 tablet (4 mg total) by mouth every 6 (six) hours.     tiotropium 18 MCG inhalation capsule  Commonly known as:  SPIRIVA  Place 1 capsule (18 mcg total) into inhaler and inhale daily.       Allergies  Allergen Reactions  . Morphine And Related Hives and Itching    Burning on skin  . Motrin [Ibuprofen] Hives and Itching    Burning on skin        Follow-up Information    Schedule an appointment as soon as possible for a visit with [facility].   Why:  for recheck of your visit in the ER today and to have a repeat chest xray in 4-6 weeks   Contact information:   [address] [place] [address] [phone]       The results of significant diagnostics from this hospitalization (including imaging, microbiology, ancillary and laboratory) are listed below for reference.    Significant  Diagnostic Studies: Dg Chest 2 View  03/23/2016  CLINICAL DATA:  Still coughing today.  Pneumonia for 1 week. EXAM: CHEST  2 VIEW COMPARISON:  03/18/2016 and 03/15/2016 FINDINGS: The airspace densities in the left lower lung have decreased since 03/15/2016 but there are residual densities. There is a small amount of volume loss or consolidation along the left major fissure. No large pleural effusions. Difficult to exclude densities in both the left upper and lower lobe. Right lung is clear. Heart and mediastinum are within normal limits. Deformity of the left clavicle compatible with an old fracture. IMPRESSION: Decreased but residual densities in the left lung. Findings compatible with pneumonia. Recommend continued follow-up to ensure resolution. Electronically Signed   By: Richarda Overlie M.D.   On: 03/23/2016 08:07   Dg Chest 2 View  03/15/2016  CLINICAL DATA:  Subacute onset of syncope, shortness of breath and chest pain. Congestion. Initial encounter. EXAM: CHEST  2 VIEW COMPARISON:  None. FINDINGS: The lungs are well-aerated. Left lingular opacity is  compatible with pneumonia. There is no evidence of pleural effusion or pneumothorax. The heart is normal in size; the mediastinal contour is within normal limits. No acute osseous abnormalities are seen. IMPRESSION: Left lingular pneumonia noted. Followup PA and lateral chest X-ray is recommended in 3-4 weeks following trial of antibiotic therapy to ensure resolution and exclude underlying malignancy. Electronically Signed   By: Roanna Raider M.D.   On: 03/15/2016 20:50   Dg Chest Port 1 View  03/18/2016  CLINICAL DATA:  Respiratory distress EXAM: PORTABLE CHEST 1 VIEW COMPARISON:  03/15/2016 FINDINGS: The heart size and mediastinal contours are normal. There is no pleural effusion identified. Persistent lingular pneumonia is identified. Right lung is clear. IMPRESSION: 1. Persistent lingular pneumonia. Electronically Signed   By: Signa Kell M.D.   On:  03/18/2016 21:42    Microbiology: Recent Results (from the past 240 hour(s))  Culture, blood (Routine X 2) w Reflex to ID Panel     Status: None   Collection Time: 03/16/16  1:29 AM  Result Value Ref Range Status   Specimen Description BLOOD LEFT WRIST  Final   Special Requests BOTTLES DRAWN AEROBIC ONLY  Final   Culture NO GROWTH 5 DAYS  Final   Report Status 03/21/2016 FINAL  Final  Culture, blood (Routine X 2) w Reflex to ID Panel     Status: None   Collection Time: 03/16/16  1:32 AM  Result Value Ref Range Status   Specimen Description BLOOD LEFT HAND  Final   Special Requests BOTTLES DRAWN AEROBIC AND ANAEROBIC  Final   Culture NO GROWTH 5 DAYS  Final   Report Status 03/21/2016 FINAL  Final  Culture, sputum-assessment     Status: None   Collection Time: 03/16/16  4:57 AM  Result Value Ref Range Status   Specimen Description EXPECTORATED SPUTUM  Final   Special Requests NONE  Final   Sputum evaluation   Final    THIS SPECIMEN IS ACCEPTABLE. RESPIRATORY CULTURE REPORT TO FOLLOW.   Report Status 03/16/2016 FINAL  Final  Culture, respiratory (NON-Expectorated)     Status: None   Collection Time: 03/16/16  4:57 AM  Result Value Ref Range Status   Specimen Description EXPECTORATED SPUTUM  Final   Special Requests NONE  Final   Gram Stain   Final    FEW WBC PRESENT,BOTH PMN AND MONONUCLEAR FEW SQUAMOUS EPITHELIAL CELLS PRESENT MODERATE GRAM POSITIVE COCCI IN PAIRS IN CHAINS FEW GRAM NEGATIVE RODS THIS SPECIMEN IS ACCEPTABLE FOR SPUTUM CULTURE Performed at [facility]    Culture   Final    NORMAL OROPHARYNGEAL FLORA Performed at [facility]    Report Status 03/18/2016 FINAL  Final  Gram stain     Status: None   Collection Time: 03/16/16  4:57 AM  Result Value Ref Range Status   Specimen Description EXPECTORATED SPUTUM  Final   Special Requests NONE  Final   Gram Stain   Final    FEW WBC PRESENT,BOTH PMN AND MONONUCLEAR MODERATE GRAM POSITIVE  COCCI IN PAIRS FEW GRAM NEGATIVE RODS RARE GRAM POSITIVE RODS    Report Status 03/16/2016 FINAL  Final     Labs: Basic Metabolic Panel:  Recent Labs Lab 03/17/16 0627  NA 139  K 4.0  CL 107  CO2 20*  GLUCOSE 108*  BUN 9  CREATININE 0.85  CALCIUM 9.2   Liver Function Tests: No results for input(s): AST, ALT, ALKPHOS, BILITOT, PROT, ALBUMIN in the last 168 hours.  No results for input(s): LIPASE, AMYLASE in the last 168 hours. No results for input(s): AMMONIA in the last 168 hours. CBC:  Recent Labs Lab 03/17/16 0627 03/18/16 1154 03/18/16 2240  WBC 17.4* 12.0* 11.8*  HGB 12.3 12.7 11.4*  HCT 37.4 38.4 35.5*  MCV 71.4* 71.6* 72.6*  PLT 400 380 353   Cardiac Enzymes:  Recent Labs Lab 03/18/16 2240 03/19/16 0444 03/19/16 0925  TROPONINI <0.03 <0.03 <0.03   BNP: BNP (last 3 results) No results for input(s): BNP in the last 8760 hours.  ProBNP (last 3 results) No results for input(s): PROBNP in the last 8760 hours.  CBG: No results for input(s): GLUCAP in the last 168 hours.     Signed:  Mir Vergie LivingMohammed Ikramullah  [facility] 03/23/2016, 12:06 PM

## 2016-07-25 ENCOUNTER — Emergency Department (HOSPITAL_COMMUNITY)
Admission: EM | Admit: 2016-07-25 | Discharge: 2016-07-25 | Disposition: A | Discharge status: Home / Self Care | Payer: Medicaid Other | Attending: Emergency Medicine | Admitting: Emergency Medicine

## 2016-07-25 ENCOUNTER — Encounter (HOSPITAL_COMMUNITY): Payer: Self-pay | Admitting: Emergency Medicine

## 2016-07-25 DIAGNOSIS — R112 Nausea with vomiting, unspecified: Secondary | ICD-10-CM | POA: Diagnosis present

## 2016-07-25 DIAGNOSIS — R197 Diarrhea, unspecified: Secondary | ICD-10-CM

## 2016-07-25 DIAGNOSIS — Z87891 Personal history of nicotine dependence: Secondary | ICD-10-CM | POA: Insufficient documentation

## 2016-07-25 DIAGNOSIS — Z7982 Long term (current) use of aspirin: Secondary | ICD-10-CM | POA: Insufficient documentation

## 2016-07-25 DIAGNOSIS — Z91040 Latex allergy status: Secondary | ICD-10-CM | POA: Diagnosis not present

## 2016-07-25 DIAGNOSIS — K529 Noninfective gastroenteritis and colitis, unspecified: Secondary | ICD-10-CM | POA: Diagnosis not present

## 2016-07-25 LAB — CBC
HCT: 44.9 % (ref 36.0–46.0)
Hemoglobin: 15 g/dL (ref 12.0–15.0)
MCH: 24.7 pg — ABNORMAL LOW (ref 26.0–34.0)
MCHC: 33.4 g/dL (ref 30.0–36.0)
MCV: 74 fL — ABNORMAL LOW (ref 78.0–100.0)
PLATELETS: 373 10*3/uL (ref 150–400)
RBC: 6.07 MIL/uL — ABNORMAL HIGH (ref 3.87–5.11)
RDW: 14.8 % (ref 11.5–15.5)
WBC: 17.1 10*3/uL — AB (ref 4.0–10.5)

## 2016-07-25 LAB — COMPREHENSIVE METABOLIC PANEL
ALT: 18 U/L (ref 14–54)
AST: 22 U/L (ref 15–41)
Albumin: 3.6 g/dL (ref 3.5–5.0)
Alkaline Phosphatase: 98 U/L (ref 38–126)
Anion gap: 8 (ref 5–15)
BILIRUBIN TOTAL: 0.5 mg/dL (ref 0.3–1.2)
BUN: 6 mg/dL (ref 6–20)
CO2: 21 mmol/L — ABNORMAL LOW (ref 22–32)
CREATININE: 0.85 mg/dL (ref 0.44–1.00)
Calcium: 9.2 mg/dL (ref 8.9–10.3)
Chloride: 107 mmol/L (ref 101–111)
GFR calc Af Amer: >60 mL/min (ref >60)
Glucose, Bld: 117 mg/dL — ABNORMAL HIGH (ref 65–99)
POTASSIUM: 4.2 mmol/L (ref 3.5–5.1)
Sodium: 136 mmol/L (ref 135–145)
TOTAL PROTEIN: 7.7 g/dL (ref 6.5–8.1)

## 2016-07-25 LAB — URINALYSIS, ROUTINE W REFLEX MICROSCOPIC
BILIRUBIN URINE: NEGATIVE
Glucose, UA: NEGATIVE mg/dL
KETONES UR: NEGATIVE mg/dL
Leukocytes, UA: NEGATIVE
Nitrite: NEGATIVE
PROTEIN: NEGATIVE mg/dL
Specific Gravity, Urine: 1.016 (ref 1.005–1.030)
pH: 8 (ref 5.0–8.0)

## 2016-07-25 LAB — URINE MICROSCOPIC-ADD ON

## 2016-07-25 LAB — LIPASE, BLOOD: Lipase: 20 U/L (ref 11–51)

## 2016-07-25 MED ORDER — ONDANSETRON 4 MG PO TBDP
4.0000 mg | ORAL_TABLET | Freq: Once | ORAL | Status: AC
  Administered 2016-07-25: 4 mg via ORAL
  Filled 2016-07-25: qty 1

## 2016-07-25 MED ORDER — ONDANSETRON HCL 4 MG/2ML IJ SOLN
4.0000 mg | Freq: Once | INTRAMUSCULAR | Status: DC
  Filled 2016-07-25: qty 2

## 2016-07-25 MED ORDER — SODIUM CHLORIDE 0.9 % IV BOLUS (SEPSIS)
1000.0000 mL | Freq: Once | INTRAVENOUS | Status: DC
Start: 2016-07-25 — End: 2016-07-26

## 2016-07-25 MED ORDER — ONDANSETRON 4 MG PO TBDP: 4.0000 mg | ORAL_TABLET | Freq: Three times a day (TID) | ORAL | 0 refills | Status: DC | PRN

## 2016-07-25 NOTE — ED Notes (Signed)
Pt tolerating PO fluids at this time, nausea resolved.

## 2016-07-25 NOTE — ED Provider Notes (Signed)
[facility]-EMERGENCY DEPT Provider Note   CSN: [id number] Arrival date & time: 07/25/16  1318     History   Chief Complaint Chief Complaint  Patient presents with  . Abdominal Pain  . Emesis    HPI Kiara Leach is a 40 y.o. female.  HPI   Kiara Leach is a 40 y.o. female, patient with a history of cholecystectomy, presenting to the ED with nausea, vomiting, and diarrhea beginning around 3 AM this morning. Nausea first began two days ago. Endorses about 7 episodes of emesis and the same of diarrhea since the episodes began. Now endorses some lightheadedness and a headache. Poor fluid intake. Has not taken any medications for her symptoms PTA. Denies fever, abdominal pain, urinary complaints, or any other complaints.      Past Medical History:  Diagnosis Date  . Back pain     Patient Active Problem List   Diagnosis Date Noted  . Chest congestion   . CAP (community acquired pneumonia) 03/16/2016  . Sepsis (HCC) 03/16/2016  . Nausea with vomiting 03/16/2016  . Sinus tachycardia (HCC) 03/16/2016    Past Surgical History:  Procedure Laterality Date  . CHOLECYSTECTOMY      OB History    No data available       Home Medications    Prior to Admission medications   Medication Sig Start Date End Date Taking? Authorizing Provider  aspirin-acetaminophen-caffeine (EXCEDRIN EXTRA STRENGTH) [id number] MG per tablet Take 3 tablets by mouth every 6 (six) hours as needed (back pain).   Yes Historical Provider, MD    Family History No family history on file.  Social History Social History  Substance Use Topics  . Smoking status: Former Games developer  . Smokeless tobacco: Not on file  . Alcohol use No     Allergies   Latex; Morphine and related; and Motrin [ibuprofen]   Review of Systems Review of Systems  Constitutional: Negative for diaphoresis and fever.  Gastrointestinal: Positive for diarrhea, nausea and vomiting. Negative for abdominal pain.  Genitourinary: Negative  for dysuria.  Neurological: Positive for light-headedness and headaches. Negative for dizziness and numbness.  All other systems reviewed and are negative.    Physical Exam Updated Vital Signs BP 136/83 (BP Location: Right Arm)   Pulse 102   Temp 98.5 F (36.9 C) (Oral)   Resp 18   Ht 5\' 2"  (1.575 m)   Wt 130.6 kg   SpO2 100%   BMI 52.68 kg/m   Physical Exam  Constitutional: She appears well-developed and well-nourished. No distress.  HENT:  Head: Normocephalic and atraumatic.  Eyes: Conjunctivae are normal.  Neck: Neck supple.  Cardiovascular: Normal rate, regular rhythm, normal heart sounds and intact distal pulses.   Pulmonary/Chest: Effort normal and breath sounds normal. No respiratory distress.  Abdominal: Soft. There is tenderness in the epigastric area. There is no guarding.  Musculoskeletal: She exhibits no edema or tenderness.  Lymphadenopathy:    She has no cervical adenopathy.  Neurological: She is alert.  Skin: Skin is warm and dry. She is not diaphoretic.  Psychiatric: She has a normal mood and affect. Her behavior is normal.  Nursing note and vitals reviewed.    ED Treatments / Results  Labs (all labs ordered are listed, but only abnormal results are displayed) Labs Reviewed  COMPREHENSIVE METABOLIC PANEL - Abnormal; Notable for the following:       Result Value   CO2 21 (*)    Glucose, Bld 117 (*)    All other  components within normal limits  URINALYSIS, ROUTINE W REFLEX MICROSCOPIC (NOT AT [facility]) - Abnormal; Notable for the following:    Hgb urine dipstick TRACE (*)    All other components within normal limits  CBC - Abnormal; Notable for the following:    WBC 17.1 (*)    RBC 6.07 (*)    MCV 74.0 (*)    MCH 24.7 (*)    All other components within normal limits  URINE MICROSCOPIC-ADD ON - Abnormal; Notable for the following:    Squamous Epithelial / LPF 0-5 (*)    Bacteria, UA FEW (*)    All other components within normal limits  LIPASE,  BLOOD    EKG  EKG Interpretation None       Radiology No results found.  Procedures Procedures (including critical care time)  Medications Ordered in ED Medications  sodium chloride 0.9 % bolus 1,000 mL (not administered)  ondansetron (ZOFRAN) injection 4 mg (not administered)  ondansetron (ZOFRAN-ODT) disintegrating tablet 4 mg (4 mg Oral Given 07/25/16 2024)     Initial Impression / Assessment and Plan / ED Course  I have reviewed the triage vital signs and the nursing notes.  Pertinent labs & imaging results that were available during my care of the patient were reviewed by me and considered in my medical decision making (see chart for details).  Clinical Course     Patient is nontoxic appearing, afebrile, not tachycardic on my exam, not tachypneic, and not hypotensive. Patient has no signs of sepsis or other serious or life-threatening condition. Abdominal tenderness likely due to vomiting. Headache and lightheadedness likely due to dehydration. Difficulty was had started an IV on this patient for IV fluid hydration. In the interim oral Zofran and oral fluids were given.  8:24 PM End of shift patient care handoff report given to Danelle BerryLeisa Tapia, PA-C. Plan: Start IVF, if IV is able to be obtained. PO challenge and discharge.   Vitals:   07/25/16 1335 07/25/16 1652 07/25/16 1925  BP: 115/84 136/83 111/77  Pulse: 106 102 92  Resp: 20 18 20   Temp: 98.7 F (37.1 C) 98.5 F (36.9 C)   TempSrc: Oral Oral   SpO2: 99% 100% 98%  Weight: 130.6 kg    Height: 5\' 2"  (1.575 m)       Final Clinical Impressions(s) / ED Diagnoses   Final diagnoses:  Nausea vomiting and diarrhea    New Prescriptions New Prescriptions   No medications on file     Concepcion LivingShawn C Dewarren Ledbetter, PA-C 07/25/16 2026    Lyndal Pulleyaniel Knott, MD 07/26/16 [phone]

## 2016-07-25 NOTE — ED Provider Notes (Signed)
Pt given to me at shift change with PO challenge vs fluids pending.  Presented with N, V and D onset today.  Given PO zofran.  Labs and UA obtained pertinent for leukocytosis, baseline anemia, no electrolyte derangements.    Results for orders placed or performed during the hospital encounter of 07/25/16  Lipase, blood  Result Value Ref Range   Lipase 20 11 - 51 U/L  Comprehensive metabolic panel  Result Value Ref Range   Sodium 136 135 - 145 mmol/L   Potassium 4.2 3.5 - 5.1 mmol/L   Chloride 107 101 - 111 mmol/L   CO2 21 (L) 22 - 32 mmol/L   Glucose, Bld 117 (H) 65 - 99 mg/dL   BUN 6 6 - 20 mg/dL   Creatinine, Ser 1.610.85 0.44 - 1.00 mg/dL   Calcium 9.2 8.9 - 09.610.3 mg/dL   Total Protein 7.7 6.5 - 8.1 g/dL   Albumin 3.6 3.5 - 5.0 g/dL   AST 22 15 - 41 U/L   ALT 18 14 - 54 U/L   Alkaline Phosphatase 98 38 - 126 U/L   Total Bilirubin 0.5 0.3 - 1.2 mg/dL   GFR calc non Af Amer >60 >60 mL/min   GFR calc Af Amer >60 >60 mL/min   Anion gap 8 5 - 15  Urinalysis, Routine w reflex microscopic  Result Value Ref Range   Color, Urine YELLOW YELLOW   APPearance CLEAR CLEAR   Specific Gravity, Urine 1.016 1.005 - 1.030   pH 8.0 5.0 - 8.0   Glucose, UA NEGATIVE NEGATIVE mg/dL   Hgb urine dipstick TRACE (A) NEGATIVE   Bilirubin Urine NEGATIVE NEGATIVE   Ketones, ur NEGATIVE NEGATIVE mg/dL   Protein, ur NEGATIVE NEGATIVE mg/dL   Nitrite NEGATIVE NEGATIVE   Leukocytes, UA NEGATIVE NEGATIVE  CBC  Result Value Ref Range   WBC 17.1 (H) 4.0 - 10.5 K/uL   RBC 6.07 (H) 3.87 - 5.11 MIL/uL   Hemoglobin 15.0 12.0 - 15.0 g/dL   HCT 04.544.9 40.936.0 - 81.146.0 %   MCV 74.0 (L) 78.0 - 100.0 fL   MCH 24.7 (L) 26.0 - 34.0 pg   MCHC 33.4 30.0 - 36.0 g/dL   RDW 91.414.8 78.211.5 - 95.615.5 %   Platelets 373 150 - 400 K/uL  Urine microscopic-add on  Result Value Ref Range   Squamous Epithelial / LPF 0-5 (A) NONE SEEN   WBC, UA 0-5 0 - 5 WBC/hpf   RBC / HPF 0-5 0 - 5 RBC/hpf   Bacteria, UA FEW (A) NONE SEEN   No results  found.   Pt is tolerating PO's.  Suspect Viral illness/gastroenteritis.  Well appearing, VSS, d/c home with zofran.  Supportive tx and return precautions reviewed.  Pt encouraged to follow up with PCP.  Vitals:   07/25/16 1652 07/25/16 1925  BP: 136/83 111/77  Pulse: 102 92  Resp: 18 20  Temp: 98.5 F (36.9 C)       Danelle BerryLeisa Kimerly Rowand, PA-C 07/25/16 2133    Lyndal Pulleyaniel Knott, MD 07/26/16 [phone]

## 2016-07-25 NOTE — ED Triage Notes (Signed)
Vomiting and aching all over since yesterday  Diarrhea,  also

## 2016-07-25 NOTE — ED Notes (Signed)
Patient left at this time with all belongings. 

## 2016-12-20 ENCOUNTER — Inpatient Hospital Stay (HOSPITAL_COMMUNITY)
Admission: AD | Admit: 2016-12-20 | Discharge: 2016-12-21 | Disposition: A | Discharge status: Home / Self Care | Payer: Medicaid Other | Source: Ambulatory Visit | Attending: Obstetrics and Gynecology | Admitting: Obstetrics and Gynecology

## 2016-12-20 ENCOUNTER — Inpatient Hospital Stay (HOSPITAL_COMMUNITY): Payer: Medicaid Other

## 2016-12-20 ENCOUNTER — Encounter (HOSPITAL_COMMUNITY): Payer: Self-pay | Admitting: *Deleted

## 2016-12-20 DIAGNOSIS — Z7982 Long term (current) use of aspirin: Secondary | ICD-10-CM | POA: Insufficient documentation

## 2016-12-20 DIAGNOSIS — Z87891 Personal history of nicotine dependence: Secondary | ICD-10-CM | POA: Insufficient documentation

## 2016-12-20 DIAGNOSIS — O00109 Unspecified tubal pregnancy without intrauterine pregnancy: Secondary | ICD-10-CM

## 2016-12-20 DIAGNOSIS — N83202 Unspecified ovarian cyst, left side: Secondary | ICD-10-CM | POA: Insufficient documentation

## 2016-12-20 DIAGNOSIS — R109 Unspecified abdominal pain: Secondary | ICD-10-CM

## 2016-12-20 DIAGNOSIS — R1013 Epigastric pain: Secondary | ICD-10-CM

## 2016-12-20 DIAGNOSIS — O00101 Right tubal pregnancy without intrauterine pregnancy: Secondary | ICD-10-CM | POA: Insufficient documentation

## 2016-12-20 DIAGNOSIS — O26899 Other specified pregnancy related conditions, unspecified trimester: Secondary | ICD-10-CM

## 2016-12-20 LAB — DIFFERENTIAL
BASOS ABS: 0.1 10*3/uL (ref 0.0–0.1)
Basophils Relative: 0 %
EOS PCT: 2 %
Eosinophils Absolute: 0.4 10*3/uL (ref 0.0–0.7)
LYMPHS ABS: 4.2 10*3/uL — AB (ref 0.7–4.0)
LYMPHS PCT: 23 %
Monocytes Absolute: 1.5 10*3/uL — ABNORMAL HIGH (ref 0.1–1.0)
Monocytes Relative: 8 %
NEUTROS ABS: 12 10*3/uL — AB (ref 1.7–7.7)
NEUTROS PCT: 66 %

## 2016-12-20 LAB — COMPREHENSIVE METABOLIC PANEL
ALK PHOS: 79 U/L (ref 38–126)
ALT: 12 U/L — ABNORMAL LOW (ref 14–54)
ANION GAP: 9 (ref 5–15)
AST: 19 U/L (ref 15–41)
Albumin: 3.4 g/dL — ABNORMAL LOW (ref 3.5–5.0)
BUN: 8 mg/dL (ref 6–20)
CALCIUM: 9 mg/dL (ref 8.9–10.3)
CHLORIDE: 105 mmol/L (ref 101–111)
CO2: 21 mmol/L — AB (ref 22–32)
Creatinine, Ser: 0.78 mg/dL (ref 0.44–1.00)
GFR calc non Af Amer: >60 mL/min (ref >60)
Glucose, Bld: 85 mg/dL (ref 65–99)
Potassium: 3.9 mmol/L (ref 3.5–5.1)
SODIUM: 135 mmol/L (ref 135–145)
Total Bilirubin: 0.7 mg/dL (ref 0.3–1.2)
Total Protein: 7.2 g/dL (ref 6.5–8.1)

## 2016-12-20 LAB — CBC
HEMATOCRIT: 37.9 % (ref 36.0–46.0)
HEMOGLOBIN: 13.2 g/dL (ref 12.0–15.0)
MCH: 24.6 pg — AB (ref 26.0–34.0)
MCHC: 34.8 g/dL (ref 30.0–36.0)
MCV: 70.7 fL — ABNORMAL LOW (ref 78.0–100.0)
Platelets: 318 10*3/uL (ref 150–400)
RBC: 5.36 MIL/uL — ABNORMAL HIGH (ref 3.87–5.11)
RDW: 15.3 % (ref 11.5–15.5)
WBC: 18.2 10*3/uL — ABNORMAL HIGH (ref 4.0–10.5)

## 2016-12-20 LAB — URINALYSIS, MICROSCOPIC (REFLEX)

## 2016-12-20 LAB — URINALYSIS, ROUTINE W REFLEX MICROSCOPIC
Bilirubin Urine: NEGATIVE
GLUCOSE, UA: NEGATIVE mg/dL
Ketones, ur: 15 mg/dL — AB
Nitrite: POSITIVE — AB
Protein, ur: NEGATIVE mg/dL
SPECIFIC GRAVITY, URINE: 1.015 (ref 1.005–1.030)
pH: 6 (ref 5.0–8.0)

## 2016-12-20 LAB — TYPE AND SCREEN
ABO/RH(D): A POS
Antibody Screen: NEGATIVE

## 2016-12-20 LAB — HCG, QUANTITATIVE, PREGNANCY: hCG, Beta Chain, Quant, S: 18407 m[IU]/mL — ABNORMAL HIGH (ref <5)

## 2016-12-20 LAB — AMYLASE: AMYLASE: 18 U/L — AB (ref 28–100)

## 2016-12-20 LAB — LIPASE, BLOOD: LIPASE: 17 U/L (ref 11–51)

## 2016-12-20 LAB — POCT PREGNANCY, URINE: Preg Test, Ur: POSITIVE — AB

## 2016-12-20 MED ORDER — FENTANYL CITRATE (PF) 100 MCG/2ML IJ SOLN: 100.0000 ug | INTRAMUSCULAR | Status: DC | PRN

## 2016-12-20 MED ORDER — SODIUM CHLORIDE 0.9 % IV BOLUS (SEPSIS)
1000.0000 mL | Freq: Once | INTRAVENOUS | Status: AC
  Administered 2016-12-20: 1000 mL via INTRAVENOUS

## 2016-12-20 MED ORDER — FENTANYL CITRATE (PF) 100 MCG/2ML IJ SOLN
100.0000 ug | INTRAMUSCULAR | Status: DC | PRN
  Administered 2016-12-20: 100 ug via INTRAMUSCULAR
  Filled 2016-12-20: qty 2

## 2016-12-20 NOTE — MAU Note (Signed)
Pt reports nausea for 2 weeks  And upper abd pain for 3 days

## 2016-12-21 ENCOUNTER — Inpatient Hospital Stay (HOSPITAL_COMMUNITY): Payer: Medicaid Other | Admitting: Anesthesiology

## 2016-12-21 ENCOUNTER — Encounter (HOSPITAL_COMMUNITY)
Admission: AD | Disposition: A | Discharge status: Home / Self Care | Payer: Self-pay | Source: Ambulatory Visit | Attending: Obstetrics and Gynecology

## 2016-12-21 DIAGNOSIS — O00109 Unspecified tubal pregnancy without intrauterine pregnancy: Secondary | ICD-10-CM

## 2016-12-21 DIAGNOSIS — O00101 Right tubal pregnancy without intrauterine pregnancy: Secondary | ICD-10-CM | POA: Diagnosis not present

## 2016-12-21 HISTORY — PX: DIAGNOSTIC LAPAROSCOPY WITH REMOVAL OF ECTOPIC PREGNANCY: SHX6449

## 2016-12-21 HISTORY — PX: LAPAROSCOPIC LYSIS OF ADHESIONS: SHX5905

## 2016-12-21 LAB — HIV ANTIBODY (ROUTINE TESTING W REFLEX): HIV Screen 4th Generation wRfx: NONREACTIVE

## 2016-12-21 LAB — ABO/RH: ABO/RH(D): A POS

## 2016-12-21 SURGERY — LAPAROSCOPY, WITH ECTOPIC PREGNANCY SURGICAL TREATMENT
Anesthesia: General | Site: Abdomen

## 2016-12-21 MED ORDER — DEXAMETHASONE SODIUM PHOSPHATE 4 MG/ML IJ SOLN
INTRAMUSCULAR | Status: DC | PRN
  Administered 2016-12-21: 10 mg via INTRAVENOUS

## 2016-12-21 MED ORDER — FAMOTIDINE IN NACL 20-0.9 MG/50ML-% IV SOLN
20.0000 mg | Freq: Once | INTRAVENOUS | Status: AC
Start: 2016-12-21 — End: 2016-12-21
  Administered 2016-12-21: 20 mg via INTRAVENOUS
  Filled 2016-12-21: qty 50

## 2016-12-21 MED ORDER — OXYCODONE-ACETAMINOPHEN 5-325 MG PO TABS
1.0000 | ORAL_TABLET | ORAL | Status: DC | PRN
  Administered 2016-12-21: 1 via ORAL

## 2016-12-21 MED ORDER — ONDANSETRON HCL 4 MG/2ML IJ SOLN
INTRAMUSCULAR | Status: DC | PRN
  Administered 2016-12-21: 4 mg via INTRAVENOUS

## 2016-12-21 MED ORDER — SUGAMMADEX SODIUM 200 MG/2ML IV SOLN
INTRAVENOUS | Status: DC | PRN
  Administered 2016-12-21: 200 mg via INTRAVENOUS

## 2016-12-21 MED ORDER — PROMETHAZINE HCL 25 MG PO TABS: 25.0000 mg | ORAL_TABLET | Freq: Four times a day (QID) | ORAL | 0 refills | Status: DC | PRN

## 2016-12-21 MED ORDER — ACETAMINOPHEN 10 MG/ML IV SOLN
1000.0000 mg | Freq: Once | INTRAVENOUS | Status: AC
  Administered 2016-12-21: 1000 mg via INTRAVENOUS
  Filled 2016-12-21: qty 100

## 2016-12-21 MED ORDER — ONDANSETRON HCL 4 MG/2ML IJ SOLN
INTRAMUSCULAR | Status: AC
  Filled 2016-12-21: qty 2

## 2016-12-21 MED ORDER — LACTATED RINGERS IV SOLN
INTRAVENOUS | Status: DC | PRN
  Administered 2016-12-21 (×2): via INTRAVENOUS

## 2016-12-21 MED ORDER — PROMETHAZINE HCL 25 MG/ML IJ SOLN: 6.2500 mg | INTRAMUSCULAR | Status: DC | PRN

## 2016-12-21 MED ORDER — MIDAZOLAM HCL 5 MG/5ML IJ SOLN
INTRAMUSCULAR | Status: DC | PRN
  Administered 2016-12-21: 2 mg via INTRAVENOUS

## 2016-12-21 MED ORDER — PROPOFOL 10 MG/ML IV BOLUS
INTRAVENOUS | Status: AC
  Filled 2016-12-21: qty 20

## 2016-12-21 MED ORDER — PROPOFOL 10 MG/ML IV BOLUS
INTRAVENOUS | Status: DC | PRN
  Administered 2016-12-21: 200 mg via INTRAVENOUS

## 2016-12-21 MED ORDER — LACTATED RINGERS IR SOLN
Status: DC | PRN
Start: 2016-12-21 — End: 2016-12-21
  Administered 2016-12-21: 3000 mL

## 2016-12-21 MED ORDER — HYDROMORPHONE HCL 1 MG/ML IJ SOLN
INTRAMUSCULAR | Status: AC
  Administered 2016-12-21: 0.5 mg via INTRAVENOUS
  Filled 2016-12-21: qty 1

## 2016-12-21 MED ORDER — SUGAMMADEX SODIUM 500 MG/5ML IV SOLN
INTRAVENOUS | Status: AC
  Filled 2016-12-21: qty 5

## 2016-12-21 MED ORDER — DEXAMETHASONE SODIUM PHOSPHATE 10 MG/ML IJ SOLN
INTRAMUSCULAR | Status: AC
  Filled 2016-12-21: qty 1

## 2016-12-21 MED ORDER — DOCUSATE SODIUM 100 MG PO CAPS: 100.0000 mg | ORAL_CAPSULE | Freq: Two times a day (BID) | ORAL | 2 refills | Status: DC | PRN

## 2016-12-21 MED ORDER — HYDROMORPHONE HCL 1 MG/ML IJ SOLN
INTRAMUSCULAR | Status: DC
Start: 2016-12-21 — End: 2016-12-21
  Filled 2016-12-21: qty 1

## 2016-12-21 MED ORDER — SOD CITRATE-CITRIC ACID 500-334 MG/5ML PO SOLN
30.0000 mL | Freq: Once | ORAL | Status: AC
  Administered 2016-12-21: 30 mL via ORAL
  Filled 2016-12-21: qty 15

## 2016-12-21 MED ORDER — LIDOCAINE HCL (CARDIAC) 20 MG/ML IV SOLN
INTRAVENOUS | Status: AC
  Filled 2016-12-21: qty 5

## 2016-12-21 MED ORDER — ROCURONIUM BROMIDE 10 MG/ML (PF) SYRINGE
PREFILLED_SYRINGE | INTRAVENOUS | Status: DC | PRN
  Administered 2016-12-21: 5 mg via INTRAVENOUS

## 2016-12-21 MED ORDER — HYDROMORPHONE HCL 1 MG/ML IJ SOLN
0.2500 mg | INTRAMUSCULAR | Status: DC | PRN
  Administered 2016-12-21 (×3): 0.5 mg via INTRAVENOUS

## 2016-12-21 MED ORDER — ROCURONIUM BROMIDE 100 MG/10ML IV SOLN
INTRAVENOUS | Status: DC | PRN
  Administered 2016-12-21: 20 mg via INTRAVENOUS

## 2016-12-21 MED ORDER — LIDOCAINE HCL (CARDIAC) 20 MG/ML IV SOLN
INTRAVENOUS | Status: DC | PRN
  Administered 2016-12-21: 30 mg via INTRAVENOUS

## 2016-12-21 MED ORDER — FENTANYL CITRATE (PF) 250 MCG/5ML IJ SOLN
INTRAMUSCULAR | Status: AC
Start: 2016-12-21 — End: 2016-12-21
  Filled 2016-12-21: qty 5

## 2016-12-21 MED ORDER — MIDAZOLAM HCL 2 MG/2ML IJ SOLN
INTRAMUSCULAR | Status: AC
  Filled 2016-12-21: qty 2

## 2016-12-21 MED ORDER — MEPERIDINE HCL 25 MG/ML IJ SOLN: 6.2500 mg | INTRAMUSCULAR | Status: DC | PRN

## 2016-12-21 MED ORDER — FENTANYL CITRATE (PF) 100 MCG/2ML IJ SOLN
INTRAMUSCULAR | Status: DC | PRN
  Administered 2016-12-21: 50 ug via INTRAVENOUS
  Administered 2016-12-21 (×4): 100 ug via INTRAVENOUS
  Administered 2016-12-21: 50 ug via INTRAVENOUS

## 2016-12-21 MED ORDER — FENTANYL CITRATE (PF) 250 MCG/5ML IJ SOLN
INTRAMUSCULAR | Status: AC
  Filled 2016-12-21: qty 5

## 2016-12-21 MED ORDER — ROCURONIUM BROMIDE 100 MG/10ML IV SOLN
INTRAVENOUS | Status: AC
  Filled 2016-12-21: qty 1

## 2016-12-21 MED ORDER — LIDOCAINE-EPINEPHRINE (PF) 1 %-1:200000 IJ SOLN
INTRAMUSCULAR | Status: DC | PRN
  Administered 2016-12-21: 20 mL

## 2016-12-21 MED ORDER — OXYCODONE-ACETAMINOPHEN 5-325 MG PO TABS: 1.0000 | ORAL_TABLET | ORAL | 0 refills | Status: DC | PRN

## 2016-12-21 MED ORDER — LACTATED RINGERS IV SOLN: INTRAVENOUS | Status: DC

## 2016-12-21 MED ORDER — SUCCINYLCHOLINE CHLORIDE 200 MG/10ML IV SOSY
PREFILLED_SYRINGE | INTRAVENOUS | Status: DC | PRN
  Administered 2016-12-21: 120 mg via INTRAVENOUS

## 2016-12-21 MED ORDER — OXYCODONE-ACETAMINOPHEN 5-325 MG PO TABS
ORAL_TABLET | ORAL | Status: AC
  Filled 2016-12-21: qty 1

## 2016-12-21 SURGICAL SUPPLY — 38 items
ADH SKN CLS APL DERMABOND .7 (GAUZE/BANDAGES/DRESSINGS) ×2
BAG SPEC RTRVL LRG 6X4 10 (ENDOMECHANICALS) ×2
BARRIER ADHS 3X4 INTERCEED (GAUZE/BANDAGES/DRESSINGS) IMPLANT
BRR ADH 4X3 ABS CNTRL BYND (GAUZE/BANDAGES/DRESSINGS)
CLOTH BEACON ORANGE TIMEOUT ST (SAFETY) ×4 IMPLANT
DERMABOND ADVANCED (GAUZE/BANDAGES/DRESSINGS) ×2
DERMABOND ADVANCED .7 DNX12 (GAUZE/BANDAGES/DRESSINGS) ×2 IMPLANT
DRSG OPSITE POSTOP 3X4 (GAUZE/BANDAGES/DRESSINGS) ×4 IMPLANT
FILTER SMOKE EVAC LAPAROSHD (FILTER) ×4 IMPLANT
GLOVE BIO SURGEON ST LM GN SZ9 (GLOVE) ×2 IMPLANT
GLOVE BIOGEL PI IND STRL 7.0 (GLOVE) ×2 IMPLANT
GLOVE BIOGEL PI IND STRL 9 (GLOVE) ×4 IMPLANT
GLOVE BIOGEL PI INDICATOR 7.0 (GLOVE) ×2
GLOVE BIOGEL PI INDICATOR 9 (GLOVE) ×4
GOWN STRL REUS W/TWL 2XL LVL3 (GOWN DISPOSABLE) ×4 IMPLANT
GOWN STRL REUS W/TWL LRG LVL3 (GOWN DISPOSABLE) ×6 IMPLANT
LIGASURE LAP ATLAS 10MM 37CM (INSTRUMENTS) IMPLANT
LIGASURE VESSEL 5MM BLUNT TIP (ELECTROSURGICAL) IMPLANT
NEEDLE INSUFFLATION 120MM (ENDOMECHANICALS) ×4 IMPLANT
NS IRRIG 1000ML POUR BTL (IV SOLUTION) ×4 IMPLANT
PACK LAPAROSCOPY BASIN (CUSTOM PROCEDURE TRAY) ×4 IMPLANT
PACK TRENDGUARD 450 HYBRID PRO (MISCELLANEOUS) IMPLANT
PACK TRENDGUARD 600 HYBRD PROC (MISCELLANEOUS) IMPLANT
POUCH SPECIMEN RETRIEVAL 10MM (ENDOMECHANICALS) ×2 IMPLANT
PROTECTOR NERVE ULNAR (MISCELLANEOUS) ×8 IMPLANT
SCISSORS LAP 5X35 DISP (ENDOMECHANICALS) IMPLANT
SET IRRIG TUBING LAPAROSCOPIC (IRRIGATION / IRRIGATOR) ×2 IMPLANT
SHEARS HARMONIC ACE PLUS 36CM (ENDOMECHANICALS) ×2 IMPLANT
SLEEVE XCEL OPT CAN 5 100 (ENDOMECHANICALS) ×4 IMPLANT
SUT VIC AB 4-0 PS2 27 (SUTURE) ×4 IMPLANT
SUT VICRYL 0 UR6 27IN ABS (SUTURE) ×8 IMPLANT
TOWEL OR 17X24 6PK STRL BLUE (TOWEL DISPOSABLE) ×8 IMPLANT
TRAY FOLEY CATH SILVER 14FR (SET/KITS/TRAYS/PACK) ×4 IMPLANT
TRENDGUARD 450 HYBRID PRO PACK (MISCELLANEOUS)
TRENDGUARD 600 HYBRID PROC PK (MISCELLANEOUS) ×4
TROCAR XCEL NON-BLD 11X100MML (ENDOMECHANICALS) ×4 IMPLANT
WARMER LAPAROSCOPE (MISCELLANEOUS) ×4 IMPLANT
WATER STERILE IRR 1000ML POUR (IV SOLUTION) ×2 IMPLANT

## 2016-12-21 NOTE — Transfer of Care (Signed)
Immediate Anesthesia Transfer of Care Note  Patient: Kiara Leach  Procedure(s) Performed: Procedure(s): DIAGNOSTIC LAPAROSCOPY WITH RIGHT SALPINGECTOMY (N/A) LAPAROSCOPIC LYSIS OF ADHESIONS  Patient Location: PACU  Anesthesia Type:General  Level of Consciousness: awake, alert , oriented and patient cooperative  Airway & Oxygen Therapy: Patient Spontanous Breathing and Patient connected to nasal cannula oxygen  Post-op Assessment: Report given to RN, Post -op Vital signs reviewed and stable and Patient moving all extremities X 4  Post vital signs: Reviewed and stable  Last Vitals:  Vitals:   12/20/16 2040 12/20/16 2141  BP: 117/58 100/67  Pulse: 71 78  Resp: 21 20  Temp: 36.7 C     Last Pain:  Vitals:   12/20/16 2139  TempSrc:   PainSc: 8          Complications: No apparent anesthesia complications

## 2016-12-21 NOTE — MAU Provider Note (Signed)
  History   this history is a duplicate. Please see my dictated admission history elsewhere  CSN: [id number]  Arrival date and time: 12/20/16 2026   First Provider Initiated Contact with Patient 12/20/16 2050      Chief Complaint  Patient presents with  . Abdominal Pain   HPI    Past Medical History:  Diagnosis Date  . Back pain     Past Surgical History:  Procedure Laterality Date  . CESAREAN SECTION    . CHOLECYSTECTOMY      No family history on file.  Social History  Substance Use Topics  . Smoking status: Former Games developermoker  . Smokeless tobacco: Not on file  . Alcohol use No    Allergies:  Allergies  Allergen Reactions  . Latex Itching  . Morphine And Related Hives and Itching    Burning on skin  . Motrin [Ibuprofen] Hives and Itching    Burning on skin     Prescriptions Prior to Admission  Medication Sig Dispense Refill Last Dose  . aspirin 325 MG tablet Take 325 mg by mouth once.   Past Week at Unknown time  . ondansetron (ZOFRAN ODT) 4 MG disintegrating tablet Take 1 tablet (4 mg total) by mouth every 8 (eight) hours as needed for nausea or vomiting. (Patient not taking: Reported on 12/20/2016) 30 tablet 0 Not Taking at Unknown time    Review of Systems Physical Exam   Blood pressure 100/67, pulse 78, temperature 98.1 F (36.7 C), temperature source Oral, resp. rate 20, last menstrual period 11/03/2016, SpO2 100 %.  Physical Exam  MAU Course  Procedures  MDM  Assessment and Plan  Ectopic pregnancy suspected left l laparoscopic removal of this evening  Llewellyn Choplin V 12/21/2016, 12:04 AM

## 2016-12-21 NOTE — MAU Provider Note (Signed)
History     CSN: [id number]  Arrival date and time: 12/20/16 2026   First Provider Initiated Contact with Patient 12/20/16 2050      Chief Complaint  Patient presents with  . Abdominal Pain   HPI Kiara Leach is a 41 year old gravida 7 para 57 female who presents for maternity admissions reporting worsening abdominal pain 2-3 days and nausea 2 weeks. She had a positive pregnancy test in the MAU today she did not know she was pregnant and is not sure when her last menstrual period is that she bled sometime in December she thinks . She had a light bit of spotting earlier today. She has pain in the lower abdomen diffuse nature slightly to the left of the midline as were its worst. Upon presentation the pain as 10 out of 10 which has been intermittent and episodic sometimes accompanied by bowel sounds. Workup in the emergency room MAU has shown her to have normal acute abdominal series, quantitative hCG of 18,000, with transvaginal ultrasound showing no identifiable intrauterine gestational sac. There is slightly increased cul-de-sac fluid. The image quality is not sufficient to identify the location of the ectopic pregnancy. There is a small hemorrhagic cyst in the left ovary  Pertinent Gynecological History: Menses: Uncertain LMP Bleeding: Likely bled today Contraception: none DES exposure: unknown Blood transfusions: none Sexually transmitted diseases: no past history Previous GYN Procedures: Cesarean section 3. She's had a subsequent laparoscopic cholecystectomy which was uncomplicated and performed laparoscopic  Last mammogram:  Date:  Last pap: None reported in Epic Date:    Past Medical History:  Diagnosis Date  . Back pain     Past Surgical History:  Procedure Laterality Date  . CESAREAN SECTION    . CHOLECYSTECTOMY      No family history on file.  Social History  Substance Use Topics  . Smoking status: Former Games developer  . Smokeless tobacco: Not on file  . Alcohol use  No    Allergies:  Allergies  Allergen Reactions  . Latex Itching  . Morphine And Related Hives and Itching    Burning on skin  . Motrin [Ibuprofen] Hives and Itching    Burning on skin     Prescriptions Prior to Admission  Medication Sig Dispense Refill Last Dose  . aspirin 325 MG tablet Take 325 mg by mouth once.   Past Week at Unknown time  . ondansetron (ZOFRAN ODT) 4 MG disintegrating tablet Take 1 tablet (4 mg total) by mouth every 8 (eight) hours as needed for nausea or vomiting. (Patient not taking: Reported on 12/20/2016) 30 tablet 0 Not Taking at Unknown time    Review of Systems  Constitutional: Positive for appetite change.  HENT: Negative.   Respiratory: Negative.   Cardiovascular: Negative.   Gastrointestinal: Positive for abdominal pain and constipation.  Hematological: Negative.   Psychiatric/Behavioral: Negative.    Physical Exam   Blood pressure 100/67, pulse 78, temperature 98.1 F (36.7 C), temperature source Oral, resp. rate 20, last menstrual period 11/03/2016, SpO2 100 %.  Physical Exam  Constitutional: She is oriented to person, place, and time. She appears well-developed and well-nourished. She appears distressed.  HENT:  Head: Normocephalic.  Eyes: Pupils are equal, round, and reactive to light.  Neck: Normal range of motion.  Cardiovascular: Normal rate and regular rhythm.   Respiratory: Effort normal and breath sounds normal. No respiratory distress. She has no wheezes. She has no rales. She exhibits no tenderness.  GI: Bowel sounds are normal. She exhibits  no mass. There is tenderness. There is guarding. There is no rebound.  Abdomen as is obese with well-healed lower abdominal Pfannenstiel incision and evidence of laparoscopic cholecystectomy and was small incisions the umbilical incision is supraumbilical  Genitourinary:  Genitourinary Comments: We see ultrasound report  Musculoskeletal: Normal range of motion.  Neurological: She is alert and  oriented to person, place, and time.  Skin: Skin is warm and dry. No erythema.  Psychiatric: She has a normal mood and affect. Her behavior is normal. Judgment and thought content normal.   CBC    Component Value Date/Time   WBC 18.2 (H) 12/20/2016 2131   RBC 5.36 (H) 12/20/2016 2131   HGB 13.2 12/20/2016 2131   HCT 37.9 12/20/2016 2131   PLT 318 12/20/2016 2131   MCV 70.7 (L) 12/20/2016 2131   MCH 24.6 (L) 12/20/2016 2131   MCHC 34.8 12/20/2016 2131   RDW 15.3 12/20/2016 2131   LYMPHSABS 4.2 (H) 12/20/2016 2131   MONOABS 1.5 (H) 12/20/2016 2131   EOSABS 0.4 12/20/2016 2131   BASOSABS 0.1 12/20/2016 2131   Labs: Results for orders placed or performed during the hospital encounter of 12/20/16 (from the past 24 hour(s))  Urinalysis, Routine w reflex microscopic   Collection Time: 12/20/16  8:41 PM  Result Value Ref Range   Color, Urine YELLOW YELLOW   APPearance CLEAR CLEAR   Specific Gravity, Urine 1.015 1.005 - 1.030   pH 6.0 5.0 - 8.0   Glucose, UA NEGATIVE NEGATIVE mg/dL   Hgb urine dipstick SMALL (A) NEGATIVE   Bilirubin Urine NEGATIVE NEGATIVE   Ketones, ur 15 (A) NEGATIVE mg/dL   Protein, ur NEGATIVE NEGATIVE mg/dL   Nitrite POSITIVE (A) NEGATIVE   Leukocytes, UA SMALL (A) NEGATIVE  Urinalysis, Microscopic (reflex)   Collection Time: 12/20/16  8:41 PM  Result Value Ref Range   RBC / HPF 0-5 0 - 5 RBC/hpf   WBC, UA 0-5 0 - 5 WBC/hpf   Bacteria, UA MANY (A) NONE SEEN   Squamous Epithelial / LPF 0-5 (A) NONE SEEN  Pregnancy, urine POC   Collection Time: 12/20/16  8:48 PM  Result Value Ref Range   Preg Test, Ur POSITIVE (A) NEGATIVE  hCG, quantitative, pregnancy   Collection Time: 12/20/16  9:31 PM  Result Value Ref Range   hCG, Beta Chain, Quant, S 18,407 (H) <5 mIU/mL  CBC   Collection Time: 12/20/16  9:31 PM  Result Value Ref Range   WBC 18.2 (H) 4.0 - 10.5 K/uL   RBC 5.36 (H) 3.87 - 5.11 MIL/uL   Hemoglobin 13.2 12.0 - 15.0 g/dL   HCT 16.1 09.6 - 04.5 %    MCV 70.7 (L) 78.0 - 100.0 fL   MCH 24.6 (L) 26.0 - 34.0 pg   MCHC 34.8 30.0 - 36.0 g/dL   RDW 40.9 81.1 - 91.4 %   Platelets 318 150 - 400 K/uL  Differential   Collection Time: 12/20/16  9:31 PM  Result Value Ref Range   Neutrophils Relative % 66 %   Neutro Abs 12.0 (H) 1.7 - 7.7 K/uL   Lymphocytes Relative 23 %   Lymphs Abs 4.2 (H) 0.7 - 4.0 K/uL   Monocytes Relative 8 %   Monocytes Absolute 1.5 (H) 0.1 - 1.0 K/uL   Eosinophils Relative 2 %   Eosinophils Absolute 0.4 0.0 - 0.7 K/uL   Basophils Relative 0 %   Basophils Absolute 0.1 0.0 - 0.1 K/uL  Comprehensive metabolic panel   Collection  Time: 12/20/16  9:32 PM  Result Value Ref Range   Sodium 135 135 - 145 mmol/L   Potassium 3.9 3.5 - 5.1 mmol/L   Chloride 105 101 - 111 mmol/L   CO2 21 (L) 22 - 32 mmol/L   Glucose, Bld 85 65 - 99 mg/dL   BUN 8 6 - 20 mg/dL   Creatinine, Ser 1.61 0.44 - 1.00 mg/dL   Calcium 9.0 8.9 - 09.6 mg/dL   Total Protein 7.2 6.5 - 8.1 g/dL   Albumin 3.4 (L) 3.5 - 5.0 g/dL   AST 19 15 - 41 U/L   ALT 12 (L) 14 - 54 U/L   Alkaline Phosphatase 79 38 - 126 U/L   Total Bilirubin 0.7 0.3 - 1.2 mg/dL   GFR calc non Af Amer >60 >60 mL/min   GFR calc Af Amer >60 >60 mL/min   Anion gap 9 5 - 15  Amylase   Collection Time: 12/20/16  9:32 PM  Result Value Ref Range   Amylase 18 (L) 28 - 100 U/L  Lipase, blood   Collection Time: 12/20/16  9:32 PM  Result Value Ref Range   Lipase 17 11 - 51 U/L  Type and screen   Collection Time: 12/20/16  9:32 PM  Result Value Ref Range   ABO/RH(D) A POS    Antibody Screen NEG    Sample Expiration 12/23/2016     Ultrasound Studies:   US Ob Comp Less 14 Wks  Addendum Date: 12/20/2016   ADDENDUM REPORT: 12/20/2016 23:25 ADDENDUM: Additional clinical information:  Quantitative beta HCG of 18,407. Additional findings: Intrauterine gestational sac:  None. Yolk sac:  No. Embryo: No. Addendum to impression: Moderate volume of pelvic fluid in the setting of elevated HCG  and nonvisualized intrauterine pregnancy may represent a ruptured ectopic pregnancy or ruptured hemorrhagic cyst given bilateral ovarian hemorrhagic cysts. Follow-up as clinically indicated. Electronically Signed   By: Mitzi Hansen M.D.   On: 12/20/2016 23:25   Result Date: 12/20/2016 CLINICAL DATA:  41 y/o  F; two days of abdominal pain. EXAM: TRANSABDOMINAL AND TRANSVAGINAL ULTRASOUND OF PELVIS DOPPLER ULTRASOUND OF OVARIES TECHNIQUE: Both transabdominal and transvaginal ultrasound examinations of the pelvis were performed. Transabdominal technique was performed for global imaging of the pelvis including uterus, ovaries, adnexal regions, and pelvic cul-de-sac. It was necessary to proceed with endovaginal exam following the transabdominal exam to visualize the adnexa. Color and duplex Doppler ultrasound was utilized to evaluate blood flow to the ovaries. COMPARISON:  None. FINDINGS: Uterus No fibroids or other mass visualized. Endometrium Thickness: 12 mm.  No focal abnormality visualized. Right ovary Measurements: 6.4 x 4.0 cm. Avascular cystic lesion with lacy internal echoes consistent with hemorrhagic cyst measuring 3.9 x 4.5 x 4.2 cm. The second avascular cystic lesion has more ground-glass like appearance, possibly a hemorrhagic cyst or endometrioma, measuring 3.2 x 3.2 x 3.4 cm. Left ovary Measurements: 5.1 x 3.8 x 5.1 cm. Avascular cystic lesion with lacy internal echoes consistent with a hemorrhagic cyst measuring 3.2 x 2.5 x 2.3 cm. Pulsed Doppler evaluation of both ovaries demonstrates normal low-resistance arterial and venous waveforms. Other findings Moderate volume of mildly echogenic free fluid in the pelvis. IMPRESSION: 1. Bilateral ovarian hemorrhagic cysts. 2. Second right ovarian cystic lesion with more ground-glass like echotexture, probably representing hemorrhagic cysts, but possibly endometrioma. Follow-up ultrasound in 6-12 weeks is recommended to ensure resolution. This  recommendation follows the consensus statement: Management of Asymptomatic Ovarian and Other Adnexal Cysts Imaged at [place]: Society  of Radiologists in Ultrasound Consensus Conference Statement. Radiology 2010; [phone]. 3. Moderate volume of mildly echogenic fluid in the pelvis probably represents hemorrhagic cyst rupture. 4. No intrauterine pregnancy identified. Electronically Signed: By: Mitzi HansenLance  Furusawa-Stratton M.D. On: 12/20/2016 22:00   [place] Ob Transvaginal  Addendum Date: 12/20/2016   ADDENDUM REPORT: 12/20/2016 23:25 ADDENDUM: Additional clinical information:  Quantitative beta HCG of 18,407. Additional findings: Intrauterine gestational sac:  None. Yolk sac:  No. Embryo: No. Addendum to impression: Moderate volume of pelvic fluid in the setting of elevated HCG and nonvisualized intrauterine pregnancy may represent a ruptured ectopic pregnancy or ruptured hemorrhagic cyst given bilateral ovarian hemorrhagic cysts. Follow-up as clinically indicated. Electronically Signed   By: Mitzi HansenLance  Furusawa-Stratton M.D.   On: 12/20/2016 23:25   Result Date: 12/20/2016 CLINICAL DATA:  41 y/o  F; two days of abdominal pain. EXAM: TRANSABDOMINAL AND TRANSVAGINAL ULTRASOUND OF PELVIS DOPPLER ULTRASOUND OF OVARIES TECHNIQUE: Both transabdominal and transvaginal ultrasound examinations of the pelvis were performed. Transabdominal technique was performed for global imaging of the pelvis including uterus, ovaries, adnexal regions, and pelvic cul-de-sac. It was necessary to proceed with endovaginal exam following the transabdominal exam to visualize the adnexa. Color and duplex Doppler ultrasound was utilized to evaluate blood flow to the ovaries. COMPARISON:  None. FINDINGS: Uterus No fibroids or other mass visualized. Endometrium Thickness: 12 mm.  No focal abnormality visualized. Right ovary Measurements: 6.4 x 4.0 cm. Avascular cystic lesion with lacy internal echoes consistent with hemorrhagic cyst measuring 3.9 x 4.5 x 4.2  cm. The second avascular cystic lesion has more ground-glass like appearance, possibly a hemorrhagic cyst or endometrioma, measuring 3.2 x 3.2 x 3.4 cm. Left ovary Measurements: 5.1 x 3.8 x 5.1 cm. Avascular cystic lesion with lacy internal echoes consistent with a hemorrhagic cyst measuring 3.2 x 2.5 x 2.3 cm. Pulsed Doppler evaluation of both ovaries demonstrates normal low-resistance arterial and venous waveforms. Other findings Moderate volume of mildly echogenic free fluid in the pelvis. IMPRESSION: 1. Bilateral ovarian hemorrhagic cysts. 2. Second right ovarian cystic lesion with more ground-glass like echotexture, probably representing hemorrhagic cysts, but possibly endometrioma. Follow-up ultrasound in 6-12 weeks is recommended to ensure resolution. This recommendation follows the consensus statement: Management of Asymptomatic Ovarian and Other Adnexal Cysts Imaged at [place]: Society of Radiologists in Ultrasound Consensus Conference Statement. Radiology 2010; [id number]. 3. Moderate volume of mildly echogenic fluid in the pelvis probably represents hemorrhagic cyst rupture. 4. No intrauterine pregnancy identified. Electronically Signed: By: Mitzi HansenLance  Furusawa-Stratton M.D. On: 12/20/2016 22:00   [place] Art/ven Flow Abd Pelv Doppler  Addendum Date: 12/20/2016   ADDENDUM REPORT: 12/20/2016 23:25 ADDENDUM: Additional clinical information:  Quantitative beta HCG of 18,407. Additional findings: Intrauterine gestational sac:  None. Yolk sac:  No. Embryo: No. Addendum to impression: Moderate volume of pelvic fluid in the setting of elevated HCG and nonvisualized intrauterine pregnancy may represent a ruptured ectopic pregnancy or ruptured hemorrhagic cyst given bilateral ovarian hemorrhagic cysts. Follow-up as clinically indicated. Electronically Signed   By: Mitzi HansenLance  Furusawa-Stratton M.D.   On: 12/20/2016 23:25   Result Date: 12/20/2016 CLINICAL DATA:  41 y/o  F; two days of abdominal pain. EXAM: TRANSABDOMINAL AND  TRANSVAGINAL ULTRASOUND OF PELVIS DOPPLER ULTRASOUND OF OVARIES TECHNIQUE: Both transabdominal and transvaginal ultrasound examinations of the pelvis were performed. Transabdominal technique was performed for global imaging of the pelvis including uterus, ovaries, adnexal regions, and pelvic cul-de-sac. It was necessary to proceed with endovaginal exam following the transabdominal exam to visualize the adnexa. Color and  duplex Doppler ultrasound was utilized to evaluate blood flow to the ovaries. COMPARISON:  None. FINDINGS: Uterus No fibroids or other mass visualized. Endometrium Thickness: 12 mm.  No focal abnormality visualized. Right ovary Measurements: 6.4 x 4.0 cm. Avascular cystic lesion with lacy internal echoes consistent with hemorrhagic cyst measuring 3.9 x 4.5 x 4.2 cm. The second avascular cystic lesion has more ground-glass like appearance, possibly a hemorrhagic cyst or endometrioma, measuring 3.2 x 3.2 x 3.4 cm. Left ovary Measurements: 5.1 x 3.8 x 5.1 cm. Avascular cystic lesion with lacy internal echoes consistent with a hemorrhagic cyst measuring 3.2 x 2.5 x 2.3 cm. Pulsed Doppler evaluation of both ovaries demonstrates normal low-resistance arterial and venous waveforms. Other findings Moderate volume of mildly echogenic free fluid in the pelvis. IMPRESSION: 1. Bilateral ovarian hemorrhagic cysts. 2. Second right ovarian cystic lesion with more ground-glass like echotexture, probably representing hemorrhagic cysts, but possibly endometrioma. Follow-up ultrasound in 6-12 weeks is recommended to ensure resolution. This recommendation follows the consensus statement: Management of Asymptomatic Ovarian and Other Adnexal Cysts Imaged at [place]: Society of Radiologists in Ultrasound Consensus Conference Statement. Radiology 2010; [phone]. 3. Moderate volume of mildly echogenic fluid in the pelvis probably represents hemorrhagic cyst rupture. 4. No intrauterine pregnancy identified. Electronically  Signed: By: Mitzi Hansen M.D. On: 12/20/2016 22:00   Dg Abd Acute W/chest  Result Date: 12/20/2016 CLINICAL DATA:  Epigastric pain. Nausea for 2 weeks. Pregnant patient in likely first-trimester pregnancy. EXAM: DG ABDOMEN ACUTE W/ 1V CHEST COMPARISON:  None. FINDINGS: The cardiomediastinal contours are normal. The lungs are clear. There is no free intra-abdominal air. No dilated bowel loops to suggest obstruction. Cholecystectomy clips in the right upper quadrant. Increased air throughout the transverse colon without air-fluid levels, doubtful clinical significance. Small volume colonic stool. No radiopaque calculi. No acute osseous abnormalities are seen. Patient shielded to limit radiation of the pelvis. IMPRESSION: Negative abdominal radiographs.  No acute cardiopulmonary disease. Electronically Signed   By: Rubye Oaks M.D.   On: 12/20/2016 22:57    Assessment: Presumed ectopic pregnancy  Plan: The patient is nothing by mouth and hasn't had her last solid food at 4 PM, small amount she is been counseled over the recommended surgery diagnostic laparoscopy with laparoscopic removal of ectopic pregnancy. She's been made aware that possible open procedure may be required. The usual risks of surgery including injury to adjacent organs and need for additional surgery specifically laparotomy, has been discussed. Adjacent organs including bowel bladder. The likelihood of adhesions is increased with a number of abdominal surgeries. The patient has been offered an opportunity to ask questions and those questions answered to patient's satisfaction.   MAU Course  Procedures  MDM   Assessment and Plan  Plan to operating room for laparoscopic removal of a presumed ectopic pregnancy probable by salpingectomy OR notified Adley Castello V 12/21/2016, 12:07 AM

## 2016-12-21 NOTE — Brief Op Note (Signed)
12/20/2016 - 12/21/2016  2:54 AM  PATIENT:  Collier Flowers  41 y.o. female  PRE-OPERATIVE DIAGNOSIS:  ectopic pregnancy  POST-OPERATIVE DIAGNOSIS:  ectopic pregnancy, right  PROCEDURE:  Procedure(s): DIAGNOSTIC LAPAROSCOPY WITH RIGHT SALPINGECTOMY (N/A) LAPAROSCOPIC LYSIS OF ADHESIONS  SURGEON:  Surgeon(s) and Role:    * Tilda Burrow, MD - Primary  PHYSICIAN ASSISTANT:   ASSISTANTS: none   ANESTHESIA:   local and spinal  EBL:  Total I/O In: 1400 [I.V.:1400] Out: 350 [Urine:300; Blood:50]  BLOOD ADMINISTERED:none  DRAINS: none   LOCAL MEDICATIONS USED:  MARCAINE    and Amount: 20 ml  SPECIMEN:  Source of Specimen:  Right fallopian tube with ectopic  DISPOSITION OF SPECIMEN:  PATHOLOGY  COUNTS:  YES  TOURNIQUET:  * No tourniquets in log *  DICTATION: .Dragon Dictation  PLAN OF CARE: Admit for overnight observation, patient has no one to pick her up until morning  PATIENT DISPOSITION:  PACU - hemodynamically stable.   Delay start of Pharmacological VTE agent (>24hrs) due to surgical blood loss or risk of bleeding: not applicable Details of procedure. Patient was taken operating room prepped and draped for abdominal procedure with legs in low lithotomy yellowfin support. Timeout was conducted and confirmed procedure by operative team. An infraumbilical vertical 1 cm skin incision was made as well as a transverse suprapubic incision to the right of the midline. The Veress needle was introduced through the umbilicus and water droplet technique confirmed free flow into the abdominal cavity. Aspiration did not withdraw any of the fluid or any blood. The pneumoperitoneum was achieved 2 L CO2 and the 10 mm laparoscopic trocar was inserted directly without difficulty. There was obviously some omental adhesions to the lower abdomen just below the laparoscopic insertion site but by rotating moving to the right of these adhesions were able to see an unruptured right ectopic pregnancy  with a distended right tube. There was extensive adhesions the uterus was densely adherent to the anterior abdominal wall at site of prior cesarean sections and the omentum was stuck to the uterine fundus and to the anterior abdominal wall. Location the suprapubic trocar had to be modified in order to acknowledge the adhesions and so was placed at an angle going in on the right side and was, 5 mm trocar, without any bleeding or clinical difficulties. The 11 mm trocar was placed in the right under and parallel to the umbilicus lateral to the rectus muscles difficulty and placed under direct visualization. Attention was directed to the tube which was then amputated using harmonic scalpel, Harmonic Ace 7 with excellent hemostasis. Specimen was placed in an Endo Catch bag and extracted through the right lower quadrant trocar site. Was reinserted and pelvis inspected. We then used Harmonic scalpel to take down the omental adhesions down to the uterine fundus and the release some of the adhesions from the uterus to the anterior abdominal wall. We're unable to completely dissect this due to suboptimal landmarks. Was bowel anywhere near the dissection. On the left side you could barely see the left tube it did not appear that there was any pathology ovary on that side. On the right side there was a large cystic right ovary but no suspicious no suspicion for pathology and the ovary. Hemostasis again confirmed. Pelvis was irrigated and 200 cc saline left in the abdomen to assist with evacuation of the carbon dioxide. The abdomen was deflated, laparoscopic instruments removed, efforts made to close the fascia at the umbilicus and in the  right lower quadrant due to the 10 mm trocar that was used. Shin of fascia and some adjacent fatty tissue appeared to be achieved at both sites using S retractors for visualization suprapubic site was 5 mm in no deep sutures were required. Subcuticular 4-0 Vicryl at all 3 sites was then  performed and then glue placed on the skin for skin wound closure and then went to recovery room in stable condition. Because she has no family to care for her tonight she will be observed until morning.

## 2016-12-21 NOTE — H&P (Signed)
History   CSN: [id number]  Arrival date and time: 12/20/16 2026   First Provider Initiated Contact with Patient 12/20/16 2050         Chief Complaint  Patient presents with  . Abdominal Pain   HPI Miss Kiara Leach is a 41 year old gravida 7 para [id number] female who presents for maternity admissions reporting worsening abdominal pain 2-3 days and nausea 2 weeks. She had a positive pregnancy test in the MAU today she did not know she was pregnant and is not sure when her last menstrual period is that she bled sometime in December she thinks . She had a light bit of spotting earlier today. She has pain in the lower abdomen diffuse nature slightly to the left of the midline as were its worst. Upon presentation the pain as 10 out of 10 which has been intermittent and episodic sometimes accompanied by bowel sounds. Workup in the emergency room [facility]U has shown her to have normal acute abdominal series, quantitative hCG of 18,000, with transvaginal ultrasound showing no identifiable intrauterine gestational sac. There is slightly increased cul-de-sac fluid. The image quality is not sufficient to identify the location of the ectopic pregnancy. There is a small hemorrhagic cyst in the left ovary  Pertinent Gynecological History: Menses: Uncertain LMP Bleeding: Likely bled today Contraception: none DES exposure: unknown Blood transfusions: none Sexually transmitted diseases: no past history Previous GYN Procedures: Cesarean section 3. She's had a subsequent laparoscopic cholecystectomy which was uncomplicated and performed laparoscopic  Last mammogram:  Date:  Last pap: None reported in Epic Date:        Past Medical History:  Diagnosis Date  . Back pain          Past Surgical History:  Procedure Laterality Date  . CESAREAN SECTION    . CHOLECYSTECTOMY      No family history on file.      Social History  Substance Use Topics  . Smoking status: Former Games developermoker  .  Smokeless tobacco: Not on file  . Alcohol use No    Allergies:       Allergies  Allergen Reactions  . Latex Itching  . Morphine And Related Hives and Itching    Burning on skin  . Motrin [Ibuprofen] Hives and Itching    Burning on skin            Prescriptions Prior to Admission  Medication Sig Dispense Refill Last Dose  . aspirin 325 MG tablet Take 325 mg by mouth once.   Past Week at Unknown time  . ondansetron (ZOFRAN ODT) 4 MG disintegrating tablet Take 1 tablet (4 mg total) by mouth every 8 (eight) hours as needed for nausea or vomiting. (Patient not taking: Reported on 12/20/2016) 30 tablet 0 Not Taking at Unknown time    Review of Systems  Constitutional: Positive for appetite change.  HENT: Negative.   Respiratory: Negative.   Cardiovascular: Negative.   Gastrointestinal: Positive for abdominal pain and constipation.  Hematological: Negative.   Psychiatric/Behavioral: Negative.    Physical Exam   Blood pressure 100/67, pulse 78, temperature 98.1 F (36.7 C), temperature source Oral, resp. rate 20, last menstrual period 11/03/2016, SpO2 100 %.  Physical Exam  Constitutional: She is oriented to person, place, and time. She appears well-developed and well-nourished. She appears distressed.  HENT:  Head: Normocephalic.  Eyes: Pupils are equal, round, and reactive to light.  Neck: Normal range of motion.  Cardiovascular: Normal rate and regular rhythm.   Respiratory: Effort normal  and breath sounds normal. No respiratory distress. She has no wheezes. She has no rales. She exhibits no tenderness.  GI: Bowel sounds are normal. She exhibits no mass. There is tenderness. There is guarding. There is no rebound.  Abdomen as is obese with well-healed lower abdominal Pfannenstiel incision and evidence of laparoscopic cholecystectomy and was small incisions the umbilical incision is supraumbilical  Genitourinary:  Genitourinary Comments: We see ultrasound report   Musculoskeletal: Normal range of motion.  Neurological: She is alert and oriented to person, place, and time.  Skin: Skin is warm and dry. No erythema.  Psychiatric: She has a normal mood and affect. Her behavior is normal. Judgment and thought content normal.   CBC Labs (Brief)          Component Value Date/Time   WBC 18.2 (H) 12/20/2016 2131   RBC 5.36 (H) 12/20/2016 2131   HGB 13.2 12/20/2016 2131   HCT 37.9 12/20/2016 2131   PLT 318 12/20/2016 2131   MCV 70.7 (L) 12/20/2016 2131   MCH 24.6 (L) 12/20/2016 2131   MCHC 34.8 12/20/2016 2131   RDW 15.3 12/20/2016 2131   LYMPHSABS 4.2 (H) 12/20/2016 2131   MONOABS 1.5 (H) 12/20/2016 2131   EOSABS 0.4 12/20/2016 2131   BASOSABS 0.1 12/20/2016 2131     Labs:      Results for orders placed or performed during the hospital encounter of 12/20/16 (from the past 24 hour(s))  Urinalysis, Routine w reflex microscopic   Collection Time: 12/20/16  8:41 PM  Result Value Ref Range   Color, Urine YELLOW YELLOW   APPearance CLEAR CLEAR   Specific Gravity, Urine 1.015 1.005 - 1.030   pH 6.0 5.0 - 8.0   Glucose, UA NEGATIVE NEGATIVE mg/dL   Hgb urine dipstick SMALL (A) NEGATIVE   Bilirubin Urine NEGATIVE NEGATIVE   Ketones, ur 15 (A) NEGATIVE mg/dL   Protein, ur NEGATIVE NEGATIVE mg/dL   Nitrite POSITIVE (A) NEGATIVE   Leukocytes, UA SMALL (A) NEGATIVE  Urinalysis, Microscopic (reflex)   Collection Time: 12/20/16  8:41 PM  Result Value Ref Range   RBC / HPF 0-5 0 - 5 RBC/hpf   WBC, UA 0-5 0 - 5 WBC/hpf   Bacteria, UA MANY (A) NONE SEEN   Squamous Epithelial / LPF 0-5 (A) NONE SEEN  Pregnancy, urine POC   Collection Time: 12/20/16  8:48 PM  Result Value Ref Range   Preg Test, Ur POSITIVE (A) NEGATIVE  hCG, quantitative, pregnancy   Collection Time: 12/20/16  9:31 PM  Result Value Ref Range   hCG, Beta Chain, Quant, S 18,407 (H) <5 mIU/mL  CBC   Collection Time: 12/20/16  9:31 PM  Result  Value Ref Range   WBC 18.2 (H) 4.0 - 10.5 K/uL   RBC 5.36 (H) 3.87 - 5.11 MIL/uL   Hemoglobin 13.2 12.0 - 15.0 g/dL   HCT 09.8 11.9 - 14.7 %   MCV 70.7 (L) 78.0 - 100.0 fL   MCH 24.6 (L) 26.0 - 34.0 pg   MCHC 34.8 30.0 - 36.0 g/dL   RDW 82.9 56.2 - 13.0 %   Platelets 318 150 - 400 K/uL  Differential   Collection Time: 12/20/16  9:31 PM  Result Value Ref Range   Neutrophils Relative % 66 %   Neutro Abs 12.0 (H) 1.7 - 7.7 K/uL   Lymphocytes Relative 23 %   Lymphs Abs 4.2 (H) 0.7 - 4.0 K/uL   Monocytes Relative 8 %   Monocytes Absolute 1.5 (  H) 0.1 - 1.0 K/uL   Eosinophils Relative 2 %   Eosinophils Absolute 0.4 0.0 - 0.7 K/uL   Basophils Relative 0 %   Basophils Absolute 0.1 0.0 - 0.1 K/uL  Comprehensive metabolic panel   Collection Time: 12/20/16  9:32 PM  Result Value Ref Range   Sodium 135 135 - 145 mmol/L   Potassium 3.9 3.5 - 5.1 mmol/L   Chloride 105 101 - 111 mmol/L   CO2 21 (L) 22 - 32 mmol/L   Glucose, Bld 85 65 - 99 mg/dL   BUN 8 6 - 20 mg/dL   Creatinine, Ser 6.29 0.44 - 1.00 mg/dL   Calcium 9.0 8.9 - 52.8 mg/dL   Total Protein 7.2 6.5 - 8.1 g/dL   Albumin 3.4 (L) 3.5 - 5.0 g/dL   AST 19 15 - 41 U/L   ALT 12 (L) 14 - 54 U/L   Alkaline Phosphatase 79 38 - 126 U/L   Total Bilirubin 0.7 0.3 - 1.2 mg/dL   GFR calc non Af Amer >60 >60 mL/min   GFR calc Af Amer >60 >60 mL/min   Anion gap 9 5 - 15  Amylase   Collection Time: 12/20/16  9:32 PM  Result Value Ref Range   Amylase 18 (L) 28 - 100 U/L  Lipase, blood   Collection Time: 12/20/16  9:32 PM  Result Value Ref Range   Lipase 17 11 - 51 U/L  Type and screen   Collection Time: 12/20/16  9:32 PM  Result Value Ref Range   ABO/RH(D) A POS    Antibody Screen NEG    Sample Expiration 12/23/2016     Ultrasound Studies:    Imaging Results  US Ob Comp Less 14 Wks  Addendum Date: 12/20/2016   ADDENDUM REPORT: 12/20/2016 23:25 ADDENDUM: Additional clinical  information:  Quantitative beta HCG of 18,407. Additional findings: Intrauterine gestational sac:  None. Yolk sac:  No. Embryo: No. Addendum to impression: Moderate volume of pelvic fluid in the setting of elevated HCG and nonvisualized intrauterine pregnancy may represent a ruptured ectopic pregnancy or ruptured hemorrhagic cyst given bilateral ovarian hemorrhagic cysts. Follow-up as clinically indicated. Electronically Signed   By: Mitzi Hansen M.D.   On: 12/20/2016 23:25   Result Date: 12/20/2016 CLINICAL DATA:  41 y/o  F; two days of abdominal pain. EXAM: TRANSABDOMINAL AND TRANSVAGINAL ULTRASOUND OF PELVIS DOPPLER ULTRASOUND OF OVARIES TECHNIQUE: Both transabdominal and transvaginal ultrasound examinations of the pelvis were performed. Transabdominal technique was performed for global imaging of the pelvis including uterus, ovaries, adnexal regions, and pelvic cul-de-sac. It was necessary to proceed with endovaginal exam following the transabdominal exam to visualize the adnexa. Color and duplex Doppler ultrasound was utilized to evaluate blood flow to the ovaries. COMPARISON:  None. FINDINGS: Uterus No fibroids or other mass visualized. Endometrium Thickness: 12 mm.  No focal abnormality visualized. Right ovary Measurements: 6.4 x 4.0 cm. Avascular cystic lesion with lacy internal echoes consistent with hemorrhagic cyst measuring 3.9 x 4.5 x 4.2 cm. The second avascular cystic lesion has more ground-glass like appearance, possibly a hemorrhagic cyst or endometrioma, measuring 3.2 x 3.2 x 3.4 cm. Left ovary Measurements: 5.1 x 3.8 x 5.1 cm. Avascular cystic lesion with lacy internal echoes consistent with a hemorrhagic cyst measuring 3.2 x 2.5 x 2.3 cm. Pulsed Doppler evaluation of both ovaries demonstrates normal low-resistance arterial and venous waveforms. Other findings Moderate volume of mildly echogenic free fluid in the pelvis. IMPRESSION: 1. Bilateral ovarian hemorrhagic cysts. 2.  Second  right ovarian cystic lesion with more ground-glass like echotexture, probably representing hemorrhagic cysts, but possibly endometrioma. Follow-up ultrasound in 6-12 weeks is recommended to ensure resolution. This recommendation follows the consensus statement: Management of Asymptomatic Ovarian and Other Adnexal Cysts Imaged at [place]: Society of Radiologists in Ultrasound Consensus Conference Statement. Radiology 2010; [phone]. 3. Moderate volume of mildly echogenic fluid in the pelvis probably represents hemorrhagic cyst rupture. 4. No intrauterine pregnancy identified. Electronically Signed: By: Mitzi Hansen M.D. On: 12/20/2016 22:00   US Ob Transvaginal  Addendum Date: 12/20/2016   ADDENDUM REPORT: 12/20/2016 23:25 ADDENDUM: Additional clinical information:  Quantitative beta HCG of 18,407. Additional findings: Intrauterine gestational sac:  None. Yolk sac:  No. Embryo: No. Addendum to impression: Moderate volume of pelvic fluid in the setting of elevated HCG and nonvisualized intrauterine pregnancy may represent a ruptured ectopic pregnancy or ruptured hemorrhagic cyst given bilateral ovarian hemorrhagic cysts. Follow-up as clinically indicated. Electronically Signed   By: Mitzi Hansen M.D.   On: 12/20/2016 23:25   Result Date: 12/20/2016 CLINICAL DATA:  41 y/o  F; two days of abdominal pain. EXAM: TRANSABDOMINAL AND TRANSVAGINAL ULTRASOUND OF PELVIS DOPPLER ULTRASOUND OF OVARIES TECHNIQUE: Both transabdominal and transvaginal ultrasound examinations of the pelvis were performed. Transabdominal technique was performed for global imaging of the pelvis including uterus, ovaries, adnexal regions, and pelvic cul-de-sac. It was necessary to proceed with endovaginal exam following the transabdominal exam to visualize the adnexa. Color and duplex Doppler ultrasound was utilized to evaluate blood flow to the ovaries. COMPARISON:  None. FINDINGS: Uterus No fibroids or other mass  visualized. Endometrium Thickness: 12 mm.  No focal abnormality visualized. Right ovary Measurements: 6.4 x 4.0 cm. Avascular cystic lesion with lacy internal echoes consistent with hemorrhagic cyst measuring 3.9 x 4.5 x 4.2 cm. The second avascular cystic lesion has more ground-glass like appearance, possibly a hemorrhagic cyst or endometrioma, measuring 3.2 x 3.2 x 3.4 cm. Left ovary Measurements: 5.1 x 3.8 x 5.1 cm. Avascular cystic lesion with lacy internal echoes consistent with a hemorrhagic cyst measuring 3.2 x 2.5 x 2.3 cm. Pulsed Doppler evaluation of both ovaries demonstrates normal low-resistance arterial and venous waveforms. Other findings Moderate volume of mildly echogenic free fluid in the pelvis. IMPRESSION: 1. Bilateral ovarian hemorrhagic cysts. 2. Second right ovarian cystic lesion with more ground-glass like echotexture, probably representing hemorrhagic cysts, but possibly endometrioma. Follow-up ultrasound in 6-12 weeks is recommended to ensure resolution. This recommendation follows the consensus statement: Management of Asymptomatic Ovarian and Other Adnexal Cysts Imaged at [place]: Society of Radiologists in Ultrasound Consensus Conference Statement. Radiology 2010; [phone]. 3. Moderate volume of mildly echogenic fluid in the pelvis probably represents hemorrhagic cyst rupture. 4. No intrauterine pregnancy identified. Electronically Signed: By: Mitzi Hansen M.D. On: 12/20/2016 22:00   [place] Art/ven Flow Abd Pelv Doppler  Addendum Date: 12/20/2016   ADDENDUM REPORT: 12/20/2016 23:25 ADDENDUM: Additional clinical information:  Quantitative beta HCG of 18,407. Additional findings: Intrauterine gestational sac:  None. Yolk sac:  No. Embryo: No. Addendum to impression: Moderate volume of pelvic fluid in the setting of elevated HCG and nonvisualized intrauterine pregnancy may represent a ruptured ectopic pregnancy or ruptured hemorrhagic cyst given bilateral ovarian hemorrhagic  cysts. Follow-up as clinically indicated. Electronically Signed   By: Mitzi Hansen M.D.   On: 12/20/2016 23:25   Result Date: 12/20/2016 CLINICAL DATA:  41 y/o  F; two days of abdominal pain. EXAM: TRANSABDOMINAL AND TRANSVAGINAL ULTRASOUND OF PELVIS DOPPLER ULTRASOUND OF OVARIES TECHNIQUE: Both  transabdominal and transvaginal ultrasound examinations of the pelvis were performed. Transabdominal technique was performed for global imaging of the pelvis including uterus, ovaries, adnexal regions, and pelvic cul-de-sac. It was necessary to proceed with endovaginal exam following the transabdominal exam to visualize the adnexa. Color and duplex Doppler ultrasound was utilized to evaluate blood flow to the ovaries. COMPARISON:  None. FINDINGS: Uterus No fibroids or other mass visualized. Endometrium Thickness: 12 mm.  No focal abnormality visualized. Right ovary Measurements: 6.4 x 4.0 cm. Avascular cystic lesion with lacy internal echoes consistent with hemorrhagic cyst measuring 3.9 x 4.5 x 4.2 cm. The second avascular cystic lesion has more ground-glass like appearance, possibly a hemorrhagic cyst or endometrioma, measuring 3.2 x 3.2 x 3.4 cm. Left ovary Measurements: 5.1 x 3.8 x 5.1 cm. Avascular cystic lesion with lacy internal echoes consistent with a hemorrhagic cyst measuring 3.2 x 2.5 x 2.3 cm. Pulsed Doppler evaluation of both ovaries demonstrates normal low-resistance arterial and venous waveforms. Other findings Moderate volume of mildly echogenic free fluid in the pelvis. IMPRESSION: 1. Bilateral ovarian hemorrhagic cysts. 2. Second right ovarian cystic lesion with more ground-glass like echotexture, probably representing hemorrhagic cysts, but possibly endometrioma. Follow-up ultrasound in 6-12 weeks is recommended to ensure resolution. This recommendation follows the consensus statement: Management of Asymptomatic Ovarian and Other Adnexal Cysts Imaged at [place]: Society of Radiologists in  Ultrasound Consensus Conference Statement. Radiology 2010; [phone]. 3. Moderate volume of mildly echogenic fluid in the pelvis probably represents hemorrhagic cyst rupture. 4. No intrauterine pregnancy identified. Electronically Signed: By: Mitzi Hansen M.D. On: 12/20/2016 22:00   Dg Abd Acute W/chest  Result Date: 12/20/2016 CLINICAL DATA:  Epigastric pain. Nausea for 2 weeks. Pregnant patient in likely first-trimester pregnancy. EXAM: DG ABDOMEN ACUTE W/ 1V CHEST COMPARISON:  None. FINDINGS: The cardiomediastinal contours are normal. The lungs are clear. There is no free intra-abdominal air. No dilated bowel loops to suggest obstruction. Cholecystectomy clips in the right upper quadrant. Increased air throughout the transverse colon without air-fluid levels, doubtful clinical significance. Small volume colonic stool. No radiopaque calculi. No acute osseous abnormalities are seen. Patient shielded to limit radiation of the pelvis. IMPRESSION: Negative abdominal radiographs.  No acute cardiopulmonary disease. Electronically Signed   By: Rubye Oaks M.D.   On: 12/20/2016 22:57     Assessment: Presumed ectopic pregnancy  Plan: The patient is nothing by mouth and hasn't had her last solid food at 4 PM, small amount she is been counseled over the recommended surgery diagnostic laparoscopy with laparoscopic removal of ectopic pregnancy. She's been made aware that possible open procedure may be required. The usual risks of surgery including injury to adjacent organs and need for additional surgery specifically laparotomy, has been discussed. Adjacent organs including bowel bladder. The likelihood of adhesions is increased with a number of abdominal surgeries. The patient has been offered an opportunity to ask questions and those questions answered to patient's satisfaction.   MAU Course  Procedures  MDM   Assessment and Plan  Plan to operating room for laparoscopic  removal of a presumed ectopic pregnancy probable by salpingectomy OR notified Dravon Nott V 12/21/2016, 12:07 AM

## 2016-12-21 NOTE — Anesthesia Preprocedure Evaluation (Addendum)
Anesthesia Evaluation  Patient identified by MRN, date of birth, ID band Patient awake    Reviewed: Allergy & Precautions, NPO status , Patient's Chart, lab work & pertinent test results  Airway Mallampati: II  TM Distance: >3 FB Neck ROM: Full    Dental  (+) Teeth Intact, Dental Advisory Given   Pulmonary former smoker,    breath sounds clear to auscultation       Cardiovascular negative cardio ROS   Rhythm:Regular Rate:Normal     Neuro/Psych negative neurological ROS  negative psych ROS   GI/Hepatic negative GI ROS, Neg liver ROS,   Endo/Other  negative endocrine ROS  Renal/GU negative Renal ROS  negative genitourinary   Musculoskeletal negative musculoskeletal ROS (+)   Abdominal   Peds negative pediatric ROS (+)  Hematology negative hematology ROS (+)   Anesthesia Other Findings   Reproductive/Obstetrics negative OB ROS                            Lab Results  Component Value Date   WBC 18.2 (H) 12/20/2016   HGB 13.2 12/20/2016   HCT 37.9 12/20/2016   MCV 70.7 (L) 12/20/2016   PLT 318 12/20/2016   Lab Results  Component Value Date   CREATININE 0.78 12/20/2016   BUN 8 12/20/2016   NA 135 12/20/2016   K 3.9 12/20/2016   CL 105 12/20/2016   CO2 21 (L) 12/20/2016   No results found for: INR, PROTIME  2017 EKG: normal sinus rhythm.   Anesthesia Physical Anesthesia Plan  ASA: III and emergent  Anesthesia Plan: General   Post-op Pain Management:    Induction: Intravenous, Rapid sequence and Cricoid pressure planned  Airway Management Planned: Oral ETT  Additional Equipment:   Intra-op Plan:   Post-operative Plan: Extubation in OR  Informed Consent: I have reviewed the patients History and Physical, chart, labs and discussed the procedure including the risks, benefits and alternatives for the proposed anesthesia with the patient or authorized representative who  has indicated his/her understanding and acceptance.   Dental advisory given  Plan Discussed with: CRNA  Anesthesia Plan Comments:        Anesthesia Quick Evaluation

## 2016-12-21 NOTE — Op Note (Signed)
Please see the brief operative note for surgicalratios blood type is A+ details.

## 2016-12-21 NOTE — Anesthesia Postprocedure Evaluation (Addendum)
Anesthesia Post Note  Patient: Collier Flowersisha Zazueta  Procedure(s) Performed: Procedure(s) (LRB): DIAGNOSTIC LAPAROSCOPY WITH RIGHT SALPINGECTOMY (N/A) LAPAROSCOPIC LYSIS OF ADHESIONS  Patient location during evaluation: PACU Anesthesia Type: General Level of consciousness: awake and alert Pain management: pain level controlled Vital Signs Assessment: post-procedure vital signs reviewed and stable Respiratory status: spontaneous breathing, nonlabored ventilation, respiratory function stable and patient connected to nasal cannula oxygen Cardiovascular status: blood pressure returned to baseline and stable Postop Assessment: no signs of nausea or vomiting Anesthetic complications: no        Last Vitals:  Vitals:   12/21/16 0300 12/21/16 0315  BP: 106/68 106/61  Pulse: 73 69  Resp: 12 (!) 24  Temp:      Last Pain:  Vitals:   12/21/16 0315  TempSrc:   PainSc: Asleep   Pain Goal:                 Shelton SilvasKevin D Joncarlos Atkison

## 2016-12-21 NOTE — Anesthesia Procedure Notes (Signed)
Procedure Name: Intubation Date/Time: 12/21/2016 1:12 AM Performed by: Gilmer Mor R Pre-anesthesia Checklist: Patient identified, Emergency Drugs available, Suction available and Patient being monitored Patient Re-evaluated:Patient Re-evaluated prior to inductionOxygen Delivery Method: Circle system utilized Preoxygenation: Pre-oxygenation with 100% oxygen Intubation Type: IV induction, Rapid sequence and Cricoid Pressure applied Laryngoscope Size: Mac and 3 Grade View: Grade II Tube type: Oral Tube size: 7.0 mm Number of attempts: 1 Airway Equipment and Method: Stylet Placement Confirmation: ETT inserted through vocal cords under direct vision,  positive ETCO2 and breath sounds checked- equal and bilateral Secured at: 21 (right lip) cm Tube secured with: Tape Dental Injury: Teeth and Oropharynx as per pre-operative assessment

## 2016-12-21 NOTE — Discharge Instructions (Signed)
Ectopic Pregnancy °An ectopic pregnancy happens when a fertilized egg grows outside the uterus. A pregnancy cannot live outside of the uterus. This problem often happens in the fallopian tube. It is often caused by damage to the fallopian tube. °If this problem is found early, you may be treated with medicine. If your tube tears or bursts open (ruptures), you will bleed inside. This is an emergency. You will need surgery. Get help right away. °What are the signs or symptoms? °You may have normal pregnancy symptoms at first. These include: °· Missing your period. °· Feeling sick to your stomach (nauseous). °· Being tired. °· Having tender breasts. ° °Then, you may start to have symptoms that are not normal. These include: °· Pain with sex (intercourse). °· Bleeding from the vagina. This includes light bleeding (spotting). °· Belly (abdomen) or lower belly cramping or pain. This may be felt on one side. °· A fast heartbeat (pulse). °· Passing out (fainting) after going poop (bowel movement). ° °If your tube tears, you may have symptoms such as: °· Really bad pain in the belly or lower belly. This happens suddenly. °· Dizziness. °· Passing out. °· Shoulder pain. ° °Get help right away if: °You have any of these symptoms. This is an emergency. °This information is not intended to replace advice given to you by your health care provider. Make sure you discuss any questions you have with your health care provider. °Document Released: 02/02/2009 Document Revised: 04/13/2016 Document Reviewed: 06/18/2013 °Elsevier Interactive Patient Education © 2017 [employer] ° °

## 2016-12-22 ENCOUNTER — Encounter (HOSPITAL_COMMUNITY): Payer: Self-pay | Admitting: Obstetrics and Gynecology

## 2016-12-23 ENCOUNTER — Other Ambulatory Visit: Payer: Self-pay | Admitting: Advanced Practice Midwife

## 2016-12-23 ENCOUNTER — Telehealth: Payer: Self-pay | Admitting: Advanced Practice Midwife

## 2016-12-23 DIAGNOSIS — N3000 Acute cystitis without hematuria: Secondary | ICD-10-CM

## 2016-12-23 LAB — CULTURE, OB URINE

## 2016-12-23 MED ORDER — SULFAMETHOXAZOLE-TRIMETHOPRIM 800-160 MG PO TABS: 1.0000 | ORAL_TABLET | Freq: Two times a day (BID) | ORAL | 0 refills | Status: DC

## 2016-12-23 NOTE — Progress Notes (Signed)
Dx UTI. Rx Bactrim.

## 2016-12-23 NOTE — Telephone Encounter (Signed)
Call to notify patient of UTI and Rx Bactrim DS. No answer. Left voicemail to call MAU.

## 2016-12-30 ENCOUNTER — Other Ambulatory Visit: Payer: Self-pay | Admitting: Obstetrics and Gynecology

## 2017-01-04 ENCOUNTER — Ambulatory Visit: Payer: Medicaid Other | Admitting: Obstetrics and Gynecology

## 2017-01-08 ENCOUNTER — Emergency Department (HOSPITAL_COMMUNITY)
Admission: EM | Admit: 2017-01-08 | Discharge: 2017-01-08 | Discharge status: Against Medical Advice | Payer: Medicaid Other

## 2017-01-08 NOTE — ED Notes (Signed)
No answer

## 2017-01-08 NOTE — ED Notes (Signed)
NO RESPONSE WHEN CALLED FOR TRIAGE X1

## 2017-01-08 NOTE — ED Triage Notes (Signed)
Called x's 3 without response 

## 2017-04-20 NOTE — Addendum Note (Signed)
Addendum  created 04/20/17 0953 by Shontay Wallner D, MD   Sign clinical note    

## 2017-10-17 ENCOUNTER — Encounter (HOSPITAL_COMMUNITY): Payer: Self-pay | Admitting: Emergency Medicine

## 2017-10-17 DIAGNOSIS — G4489 Other headache syndrome: Secondary | ICD-10-CM | POA: Diagnosis not present

## 2017-10-17 DIAGNOSIS — R05 Cough: Secondary | ICD-10-CM | POA: Insufficient documentation

## 2017-10-17 DIAGNOSIS — Z87891 Personal history of nicotine dependence: Secondary | ICD-10-CM | POA: Insufficient documentation

## 2017-10-17 NOTE — ED Triage Notes (Signed)
Patient reports migraine headache with photophobia onset this week , denies emesis or fever , pt. added occasional productive cough with chest congestion this week , no fever or chills .

## 2017-10-18 ENCOUNTER — Emergency Department (HOSPITAL_COMMUNITY)
Admission: EM | Admit: 2017-10-18 | Discharge: 2017-10-18 | Disposition: A | Discharge status: Home / Self Care | Payer: Medicaid Other | Attending: Emergency Medicine | Admitting: Emergency Medicine

## 2017-10-18 ENCOUNTER — Emergency Department (HOSPITAL_COMMUNITY): Payer: Medicaid Other

## 2017-10-18 DIAGNOSIS — R059 Cough, unspecified: Secondary | ICD-10-CM

## 2017-10-18 DIAGNOSIS — G4489 Other headache syndrome: Secondary | ICD-10-CM

## 2017-10-18 DIAGNOSIS — R05 Cough: Secondary | ICD-10-CM

## 2017-10-18 LAB — I-STAT CHEM 8, ED
BUN: 5 mg/dL — AB (ref 6–20)
CHLORIDE: 106 mmol/L (ref 101–111)
Calcium, Ion: 1.18 mmol/L (ref 1.15–1.40)
Creatinine, Ser: 0.8 mg/dL (ref 0.44–1.00)
Glucose, Bld: 97 mg/dL (ref 65–99)
HCT: 44 % (ref 36.0–46.0)
Hemoglobin: 15 g/dL (ref 12.0–15.0)
Potassium: 4.2 mmol/L (ref 3.5–5.1)
SODIUM: 141 mmol/L (ref 135–145)
TCO2: 24 mmol/L (ref 22–32)

## 2017-10-18 LAB — I-STAT BETA HCG BLOOD, ED (MC, WL, AP ONLY): I-stat hCG, quantitative: <5 m[IU]/mL (ref <5)

## 2017-10-18 MED ORDER — METOCLOPRAMIDE HCL 5 MG/ML IJ SOLN
10.0000 mg | Freq: Once | INTRAMUSCULAR | Status: AC
  Administered 2017-10-18: 10 mg via INTRAVENOUS
  Filled 2017-10-18: qty 2

## 2017-10-18 MED ORDER — DIPHENHYDRAMINE HCL 50 MG/ML IJ SOLN
25.0000 mg | Freq: Once | INTRAMUSCULAR | Status: AC
  Administered 2017-10-18: 25 mg via INTRAVENOUS
  Filled 2017-10-18: qty 1

## 2017-10-18 MED ORDER — CHLORPHENIRAMINE-PHENYLEPHRINE 1-3.5 MG/ML PO LIQD: 0.7500 mL | Freq: Four times a day (QID) | ORAL | 0 refills | Status: DC | PRN

## 2017-10-18 NOTE — Discharge Instructions (Signed)
As discussed, your evaluation today has been largely reassuring.  But, it is important that you monitor your condition carefully, and do not hesitate to return to the ED if you develop new, or concerning changes in your condition. ? ?Otherwise, please follow-up with your physician for appropriate ongoing care. ? ?

## 2017-10-18 NOTE — ED Provider Notes (Signed)
[facility] Provider Note   CSN: [id number] Arrival date & time: 10/17/17  2233     History   Chief Complaint Chief Complaint  Patient presents with  . Migraine  . Cough    HPI Kiara Leach is a 41 y.o. female.  The history is provided by the patient.  Cough  This is a new problem. The current episode started more than 2 days ago. The problem has been gradually worsening. The cough is non-productive. There has been no fever. Associated symptoms include headaches and sore throat. Associated symptoms comments: Chest congestion.  Headache   This is a new problem. The current episode started 2 days ago. The problem occurs constantly. The problem has been gradually worsening. The quality of the pain is described as dull. The pain is moderate. Associated symptoms include nausea. Pertinent negatives include no fever.  Patient with no history of any medical conditions presents with cough for the past several days. She reports is a nonproductive cough, she also reports some mild chest congestion  Patient also reports gradual onset of migraine headache for the past 2 days. No fever, no focal weakness, and no visual changes She reports his headache is similar to previous episodes She reports photophobia   Past Medical History:  Diagnosis Date  . Back pain     Patient Active Problem List   Diagnosis Date Noted  . Ectopic pregnancy, tubal 12/21/2016  . Chest congestion   . CAP (community acquired pneumonia) 03/16/2016  . Sepsis (HCC) 03/16/2016  . Nausea with vomiting 03/16/2016  . Sinus tachycardia 03/16/2016    Past Surgical History:  Procedure Laterality Date  . CESAREAN SECTION    . CHOLECYSTECTOMY    . DIAGNOSTIC LAPAROSCOPY WITH REMOVAL OF ECTOPIC PREGNANCY N/A 12/21/2016   Procedure: DIAGNOSTIC LAPAROSCOPY WITH RIGHT SALPINGECTOMY;  Surgeon: Tilda BurrowJohn Ferguson V, MD;  Location: [facility];  Service: Gynecology;  Laterality: N/A;  .  LAPAROSCOPIC LYSIS OF ADHESIONS  12/21/2016   Procedure: LAPAROSCOPIC LYSIS OF ADHESIONS;  Surgeon: Tilda BurrowJohn Ferguson V, MD;  Location: [facility];  Service: Gynecology;;    OB History    Gravida Para Term Preterm AB Living   7 6 6     6    SAB TAB Ectopic Multiple Live Births                   Home Medications    Prior to Admission medications   Not on File    Family History No family history on file.  Social History Social History   Tobacco Use  . Smoking status: Former Games developermoker  . Smokeless tobacco: Never Used  Substance Use Topics  . Alcohol use: No    Alcohol/week: 0.0 oz  . Drug use: No     Allergies   Latex; Morphine and related; and Motrin [ibuprofen]   Review of Systems Review of Systems  Constitutional: Negative for fever.  HENT: Positive for congestion and sore throat.   Eyes: Negative for visual disturbance.  Respiratory: Positive for cough.   Gastrointestinal: Positive for nausea.  Genitourinary:       Last menstrual cycle earlier this month  Neurological: Positive for headaches. Negative for weakness.  All other systems reviewed and are negative.    Physical Exam Updated Vital Signs BP 119/78 (BP Location: Right Arm)   Pulse 86   Temp 98.7 F (37.1 C) (Oral)   Resp 16   LMP 11/03/2016 (Within Weeks)   SpO2 100%  Breastfeeding? Unknown   Physical Exam  CONSTITUTIONAL: Well developed/well nourished HEAD: Normocephalic/atraumatic, covering her face with a scar EYES: EOMI/PERRL, no nystagmus, no ptosis ENMT: Mucous membranes moist NECK: supple no meningeal signs SPINE/BACK:entire spine nontender CV: S1/S2 noted, no murmurs/rubs/gallops noted LUNGS: Lungs are clear to auscultation bilaterally, cough frequently during exam ABDOMEN: soft, nontender NEURO:Awake/alert, face symmetric, no arm or leg drift is noted Cranial nerves 3/4/5/6/05/28/09/11/12 tested and intact Sensation to light touch intact in all extremities EXTREMITIES: pulses normal,  full ROM SKIN: warm, color normal PSYCH: no abnormalities of mood noted, alert and oriented to situation   ED Treatments / Results  Labs (all labs ordered are listed, but only abnormal results are displayed) Labs Reviewed  I-STAT BETA HCG BLOOD, ED ([facility], [facility], [facility] ONLY)  I-STAT CHEM 8, ED    EKG  EKG Interpretation None       Radiology No results found.  Procedures Procedures  Medications Ordered in ED Medications  metoCLOPramide (REGLAN) injection 10 mg (not administered)  diphenhydrAMINE (BENADRYL) injection 25 mg (not administered)     Initial Impression / Assessment and Plan / ED Course  I have reviewed the triage vital signs and the nursing notes.     7:11 AM In the ED for 2 complaints, headache and cough Headache is similar to previous episodes.  Pain is somewhat limited due to the fact that she keeps putting a scarf over her face during exam due to photophobia Reports cough but no obvious signs of distress Plan to treat headache for now, and get chest x-ray for cough 7:26 AM Plan at sign out to Dr. Jeraldine LootsLockwood, follow up on labs and chest x-ray Ensure Headache is improved and patient is ambulatory I have low suspicion for acute neurologic emergency at this time Final Clinical Impressions(s) / ED Diagnoses   Final diagnoses:  Cough  Other headache syndrome    ED Discharge Orders    None       Zadie RhineWickline, Jadriel Saxer, MD 10/18/17 [phone]

## 2017-10-18 NOTE — ED Notes (Signed)
Pt has returned from xray

## 2017-10-18 NOTE — ED Provider Notes (Signed)
9:25 AM Patient states that she feels better, speaking clearly, has had no new complaints, remains hemodynamically stable. Labs reassuring, patient discharged with outpatient follow-up.   Gerhard MunchLockwood, Ridge Lafond, MD 10/18/17 [phone]

## 2017-10-18 NOTE — ED Notes (Signed)
Patient transported to X-ray 

## 2018-02-16 ENCOUNTER — Emergency Department (HOSPITAL_COMMUNITY)
Admission: EM | Admit: 2018-02-16 | Discharge: 2018-02-16 | Disposition: A | Discharge status: Home / Self Care | Payer: Medicaid Other | Attending: Emergency Medicine | Admitting: Emergency Medicine

## 2018-02-16 ENCOUNTER — Encounter (HOSPITAL_COMMUNITY): Payer: Self-pay | Admitting: Emergency Medicine

## 2018-02-16 ENCOUNTER — Other Ambulatory Visit: Payer: Self-pay

## 2018-02-16 DIAGNOSIS — J039 Acute tonsillitis, unspecified: Secondary | ICD-10-CM

## 2018-02-16 DIAGNOSIS — Z91040 Latex allergy status: Secondary | ICD-10-CM | POA: Diagnosis not present

## 2018-02-16 DIAGNOSIS — Z87891 Personal history of nicotine dependence: Secondary | ICD-10-CM | POA: Insufficient documentation

## 2018-02-16 DIAGNOSIS — R070 Pain in throat: Secondary | ICD-10-CM | POA: Diagnosis present

## 2018-02-16 LAB — RAPID STREP SCREEN (MED CTR MEBANE ONLY): Streptococcus, Group A Screen (Direct): NEGATIVE

## 2018-02-16 MED ORDER — LIDOCAINE VISCOUS 2 % MT SOLN: 10.0000 mL | OROMUCOSAL | 0 refills | Status: DC | PRN

## 2018-02-16 MED ORDER — ACETAMINOPHEN 325 MG PO TABS: 650.0000 mg | ORAL_TABLET | Freq: Once | ORAL | Status: DC | PRN

## 2018-02-16 MED ORDER — GUAIFENESIN-CODEINE 100-10 MG/5ML PO SOLN: 5.0000 mL | Freq: Three times a day (TID) | ORAL | 0 refills | Status: DC | PRN

## 2018-02-16 MED ORDER — DEXAMETHASONE SODIUM PHOSPHATE 10 MG/ML IJ SOLN
INTRAMUSCULAR | Status: AC
  Filled 2018-02-16: qty 1

## 2018-02-16 MED ORDER — DEXAMETHASONE 10 MG/ML FOR PEDIATRIC ORAL USE
10.0000 mg | Freq: Once | INTRAMUSCULAR | Status: AC
  Administered 2018-02-16: 10 mg via ORAL
  Filled 2018-02-16: qty 1

## 2018-02-16 MED ORDER — AMOXICILLIN 400 MG/5ML PO SUSR: 500.0000 mg | Freq: Three times a day (TID) | ORAL | 0 refills | Status: AC

## 2018-02-16 NOTE — Discharge Instructions (Signed)
Follow up with your doctor in 2 days. If symptoms worsen and you have difficulty swallowing return here.

## 2018-02-16 NOTE — ED Triage Notes (Signed)
The patient started having ear and throat pain since this morning.  She has not been taking anything for it.  She rates the pain 10/10. .Marland Kitchen

## 2018-02-16 NOTE — ED Provider Notes (Signed)
[facility] EMERGENCY DEPARTMENT Provider Note   CSN: [id number] Arrival date & time: 02/16/18  1936     History   Chief Complaint Chief Complaint  Patient presents with  . Sore Throat  . Otalgia    HPI Kiara Leach is a 42 y.o. female who presents to the ED with sore throat, ear ache and fever. The symptoms started with in the past 24 hours. Patient reports symptoms are getting worse. Temp up to 103. Slight cough.  HPI  Past Medical History:  Diagnosis Date  . Back pain     Patient Active Problem List   Diagnosis Date Noted  . Ectopic pregnancy, tubal 12/21/2016  . Chest congestion   . CAP (community acquired pneumonia) 03/16/2016  . Sepsis (HCC) 03/16/2016  . Nausea with vomiting 03/16/2016  . Sinus tachycardia 03/16/2016    Past Surgical History:  Procedure Laterality Date  . CESAREAN SECTION    . CHOLECYSTECTOMY    . DIAGNOSTIC LAPAROSCOPY WITH REMOVAL OF ECTOPIC PREGNANCY N/A 12/21/2016   Procedure: DIAGNOSTIC LAPAROSCOPY WITH RIGHT SALPINGECTOMY;  Surgeon: Tilda Burrow, MD;  Location: [facility];  Service: Gynecology;  Laterality: N/A;  . LAPAROSCOPIC LYSIS OF ADHESIONS  12/21/2016   Procedure: LAPAROSCOPIC LYSIS OF ADHESIONS;  Surgeon: Tilda Burrow, MD;  Location: [facility];  Service: Gynecology;;     OB History    Gravida  7   Para  6   Term  6   Preterm      AB      Living  6     SAB      TAB      Ectopic      Multiple      Live Births               Home Medications    Prior to Admission medications   Medication Sig Start Date End Date Taking? Authorizing Provider  amoxicillin (AMOXIL) 400 MG/5ML suspension Take 6.3 mLs (500 mg total) by mouth 3 (three) times daily for 7 days. 02/16/18 02/23/18  Janne Napoleon, NP  chlorpheniramine-phenylephrine (CARDEC) 1-3.5 MG/ML LIQD Take 0.75 mLs by mouth 4 (four) times daily as needed. 10/18/17   Gerhard Munch, MD  guaiFENesin-codeine 100-10 MG/5ML syrup Take 5 mLs by mouth 3  (three) times daily as needed for cough. 02/16/18   Janne Napoleon, NP  lidocaine (XYLOCAINE) 2 % solution Use as directed 10 mLs in the mouth or throat every 3 (three) hours as needed for mouth pain. 02/16/18   Janne Napoleon, NP    Family History History reviewed. No pertinent family history.  Social History Social History   Tobacco Use  . Smoking status: Former Games developer  . Smokeless tobacco: Never Used  Substance Use Topics  . Alcohol use: No    Alcohol/week: 0.0 oz  . Drug use: No     Allergies   Latex; Morphine and related; and Motrin [ibuprofen]   Review of Systems Review of Systems  Constitutional: Positive for chills and fever.  HENT: Positive for congestion, ear pain and sore throat.   Eyes: Negative for discharge and redness.  Respiratory: Positive for cough. Negative for shortness of breath and wheezing.   Cardiovascular: Negative for chest pain.  Gastrointestinal: Negative for abdominal pain, nausea and vomiting.  Musculoskeletal: Negative for back pain and neck stiffness.  Skin: Negative for rash.  Neurological: Negative for syncope and headaches.  Psychiatric/Behavioral: Negative for confusion.     Physical Exam  Updated Vital Signs BP (!) 116/49 (BP Location: Right Arm)   Pulse (!) 102   Temp 99.1 F (37.3 C) (Oral)   Resp 18   LMP  (LMP Unknown)   SpO2 98%   Physical Exam  Constitutional: She appears well-developed and well-nourished. No distress.  HENT:  Head: Normocephalic.  Right Ear: Tympanic membrane normal.  Left Ear: Tympanic membrane normal.  Nose: Rhinorrhea present.  Mouth/Throat: Uvula is midline and mucous membranes are normal. Oropharyngeal exudate and posterior oropharyngeal erythema present. No tonsillar abscesses. Tonsils are 2+ on the right. Tonsillar exudate.  Eyes: EOM are normal.  Neck: Neck supple.  Cardiovascular: Regular rhythm. Tachycardia present.  Pulmonary/Chest: Effort normal. She has no wheezes. She has no rales.    Abdominal: Soft. There is no tenderness.  Musculoskeletal: Normal range of motion.  Lymphadenopathy:    She has cervical adenopathy.  Neurological: She is alert.  Skin: Skin is warm and dry.  Psychiatric: She has a normal mood and affect.  Nursing note and vitals reviewed.    ED Treatments / Results  Labs (all labs ordered are listed, but only abnormal results are displayed) Labs Reviewed  RAPID STREP SCREEN (NOT AT [facility])  CULTURE, GROUP A STREP [facility])    EKG None  Radiology No results found.  Procedures Procedures (including critical care time)  Medications Ordered in ED Medications  dexamethasone (DECADRON) 10 MG/ML injection for Pediatric ORAL use 10 mg (has no administration in time range)     Initial Impression / Assessment and Plan / ED Course  I have reviewed the triage vital signs and the nursing notes. Pt febrile with tonsillar exudate, cervical lymphadenopathy, & dysphagia; diagnosis of bacterial pharyngitis. Treated in the ED with steroids, NSAIDs and Rx for Amoxicillin.  Pt appears mildly dehydrated, discussed importance of water rehydration. Presentation non concerning for PTA or RPA. No trismus or uvula deviation. Specific return precautions discussed. Pt able to drink water in ED without difficulty with intact air way. Recommended PCP follow up.   Final Clinical Impressions(s) / ED Diagnoses   Final diagnoses:  Tonsillitis with exudate    ED Discharge Orders        Ordered    amoxicillin (AMOXIL) 400 MG/5ML suspension  3 times daily     02/16/18 2258    lidocaine (XYLOCAINE) 2 % solution  Every  3 hours PRN     02/16/18 2258    guaiFENesin-codeine 100-10 MG/5ML syrup  3 times daily PRN     02/16/18 2258       Kerrie Buffaloeese, Hope LivingstonM, NP 02/16/18 2305    Shaune PollackIsaacs, Cameron, MD 02/17/18 1122

## 2018-02-19 LAB — CULTURE, GROUP A STREP (THRC)

## 2019-01-02 ENCOUNTER — Other Ambulatory Visit: Payer: Self-pay | Admitting: Sports Medicine

## 2019-01-02 DIAGNOSIS — M545 Low back pain, unspecified: Secondary | ICD-10-CM

## 2019-01-10 ENCOUNTER — Ambulatory Visit
Admission: RE | Admit: 2019-01-10 | Discharge: 2019-01-10 | Disposition: A | Discharge status: Home / Self Care | Payer: Medicaid Other | Source: Ambulatory Visit | Attending: Sports Medicine | Admitting: Sports Medicine

## 2019-01-10 DIAGNOSIS — M545 Low back pain, unspecified: Secondary | ICD-10-CM

## 2019-03-17 ENCOUNTER — Other Ambulatory Visit: Payer: Self-pay | Admitting: Sports Medicine

## 2019-03-17 DIAGNOSIS — M542 Cervicalgia: Secondary | ICD-10-CM

## 2019-04-25 ENCOUNTER — Other Ambulatory Visit: Payer: Medicaid Other

## 2019-04-26 ENCOUNTER — Other Ambulatory Visit: Payer: Medicaid Other

## 2019-10-25 ENCOUNTER — Inpatient Hospital Stay (HOSPITAL_COMMUNITY)
Admission: EM | Admit: 2019-10-25 | Discharge: 2019-10-29 | DRG: 202 | Disposition: A | Discharge status: Home / Self Care | Payer: Medicaid Other | Attending: Internal Medicine | Admitting: Internal Medicine

## 2019-10-25 ENCOUNTER — Encounter (HOSPITAL_COMMUNITY): Payer: Self-pay | Admitting: Emergency Medicine

## 2019-10-25 ENCOUNTER — Emergency Department (HOSPITAL_COMMUNITY): Payer: Medicaid Other

## 2019-10-25 ENCOUNTER — Other Ambulatory Visit: Payer: Self-pay

## 2019-10-25 DIAGNOSIS — Z825 Family history of asthma and other chronic lower respiratory diseases: Secondary | ICD-10-CM | POA: Diagnosis not present

## 2019-10-25 DIAGNOSIS — J9600 Acute respiratory failure, unspecified whether with hypoxia or hypercapnia: Secondary | ICD-10-CM

## 2019-10-25 DIAGNOSIS — J4541 Moderate persistent asthma with (acute) exacerbation: Secondary | ICD-10-CM | POA: Diagnosis not present

## 2019-10-25 DIAGNOSIS — Z91040 Latex allergy status: Secondary | ICD-10-CM | POA: Diagnosis not present

## 2019-10-25 DIAGNOSIS — D72829 Elevated white blood cell count, unspecified: Secondary | ICD-10-CM | POA: Diagnosis not present

## 2019-10-25 DIAGNOSIS — I10 Essential (primary) hypertension: Secondary | ICD-10-CM | POA: Diagnosis not present

## 2019-10-25 DIAGNOSIS — Z20828 Contact with and (suspected) exposure to other viral communicable diseases: Secondary | ICD-10-CM | POA: Diagnosis present

## 2019-10-25 DIAGNOSIS — J9601 Acute respiratory failure with hypoxia: Secondary | ICD-10-CM | POA: Diagnosis present

## 2019-10-25 DIAGNOSIS — J4551 Severe persistent asthma with (acute) exacerbation: Secondary | ICD-10-CM | POA: Diagnosis not present

## 2019-10-25 DIAGNOSIS — T380X5A Adverse effect of glucocorticoids and synthetic analogues, initial encounter: Secondary | ICD-10-CM | POA: Diagnosis not present

## 2019-10-25 DIAGNOSIS — E875 Hyperkalemia: Secondary | ICD-10-CM | POA: Diagnosis not present

## 2019-10-25 DIAGNOSIS — Z886 Allergy status to analgesic agent status: Secondary | ICD-10-CM

## 2019-10-25 DIAGNOSIS — D72823 Leukemoid reaction: Secondary | ICD-10-CM | POA: Diagnosis not present

## 2019-10-25 DIAGNOSIS — Z885 Allergy status to narcotic agent status: Secondary | ICD-10-CM

## 2019-10-25 DIAGNOSIS — J4552 Severe persistent asthma with status asthmaticus: Principal | ICD-10-CM | POA: Diagnosis present

## 2019-10-25 DIAGNOSIS — R0602 Shortness of breath: Secondary | ICD-10-CM

## 2019-10-25 DIAGNOSIS — J45901 Unspecified asthma with (acute) exacerbation: Secondary | ICD-10-CM | POA: Diagnosis present

## 2019-10-25 DIAGNOSIS — Z6841 Body Mass Index (BMI) 40.0 and over, adult: Secondary | ICD-10-CM | POA: Diagnosis not present

## 2019-10-25 HISTORY — DX: Unspecified asthma, uncomplicated: J45.909

## 2019-10-25 LAB — CBC
HCT: 42.8 % (ref 36.0–46.0)
Hemoglobin: 13.5 g/dL (ref 12.0–15.0)
MCH: 23.6 pg — ABNORMAL LOW (ref 26.0–34.0)
MCHC: 31.5 g/dL (ref 30.0–36.0)
MCV: 74.8 fL — ABNORMAL LOW (ref 80.0–100.0)
Platelets: 420 10*3/uL — ABNORMAL HIGH (ref 150–400)
RBC: 5.72 MIL/uL — ABNORMAL HIGH (ref 3.87–5.11)
RDW: 16.1 % — ABNORMAL HIGH (ref 11.5–15.5)
WBC: 15.5 10*3/uL — ABNORMAL HIGH (ref 4.0–10.5)
nRBC: 0 % (ref 0.0–0.2)

## 2019-10-25 LAB — BASIC METABOLIC PANEL
Anion gap: 10 (ref 5–15)
BUN: 9 mg/dL (ref 6–20)
CO2: 22 mmol/L (ref 22–32)
Calcium: 9.4 mg/dL (ref 8.9–10.3)
Chloride: 110 mmol/L (ref 98–111)
Creatinine, Ser: 0.79 mg/dL (ref 0.44–1.00)
GFR calc Af Amer: >60 mL/min (ref >60)
GFR calc non Af Amer: >60 mL/min (ref >60)
Glucose, Bld: 134 mg/dL — ABNORMAL HIGH (ref 70–99)
Potassium: 3.8 mmol/L (ref 3.5–5.1)
Sodium: 142 mmol/L (ref 135–145)

## 2019-10-25 LAB — POCT I-STAT 7, (LYTES, BLD GAS, ICA,H+H)
Acid-base deficit: 5 mmol/L — ABNORMAL HIGH (ref 0.0–2.0)
Bicarbonate: 21 mmol/L (ref 20.0–28.0)
Calcium, Ion: 1.28 mmol/L (ref 1.15–1.40)
HCT: 40 % (ref 36.0–46.0)
Hemoglobin: 13.6 g/dL (ref 12.0–15.0)
O2 Saturation: 98 %
Patient temperature: 97.7
Potassium: 4 mmol/L (ref 3.5–5.1)
Sodium: 138 mmol/L (ref 135–145)
TCO2: 22 mmol/L (ref 22–32)
pCO2 arterial: 40.1 mmHg (ref 32.0–48.0)
pH, Arterial: 7.325 — ABNORMAL LOW (ref 7.350–7.450)
pO2, Arterial: 109 mmHg — ABNORMAL HIGH (ref 83.0–108.0)

## 2019-10-25 LAB — CBC WITH DIFFERENTIAL/PLATELET
Abs Immature Granulocytes: 0.07 10*3/uL (ref 0.00–0.07)
Basophils Absolute: 0.2 10*3/uL — ABNORMAL HIGH (ref 0.0–0.1)
Basophils Relative: 1 %
Eosinophils Absolute: 1.1 10*3/uL — ABNORMAL HIGH (ref 0.0–0.5)
Eosinophils Relative: 7 %
HCT: 42.8 % (ref 36.0–46.0)
Hemoglobin: 13.4 g/dL (ref 12.0–15.0)
Immature Granulocytes: 0 %
Lymphocytes Relative: 13 %
Lymphs Abs: 2.2 10*3/uL (ref 0.7–4.0)
MCH: 23.6 pg — ABNORMAL LOW (ref 26.0–34.0)
MCHC: 31.3 g/dL (ref 30.0–36.0)
MCV: 75.5 fL — ABNORMAL LOW (ref 80.0–100.0)
Monocytes Absolute: 1.4 10*3/uL — ABNORMAL HIGH (ref 0.1–1.0)
Monocytes Relative: 8 %
Neutro Abs: 12.3 10*3/uL — ABNORMAL HIGH (ref 1.7–7.7)
Neutrophils Relative %: 71 %
Platelets: 424 10*3/uL — ABNORMAL HIGH (ref 150–400)
RBC: 5.67 MIL/uL — ABNORMAL HIGH (ref 3.87–5.11)
RDW: 16.1 % — ABNORMAL HIGH (ref 11.5–15.5)
WBC: 17.2 10*3/uL — ABNORMAL HIGH (ref 4.0–10.5)
nRBC: 0 % (ref 0.0–0.2)

## 2019-10-25 LAB — LACTIC ACID, PLASMA
Lactic Acid, Venous: 2.4 mmol/L (ref 0.5–1.9)
Lactic Acid, Venous: 1.2 mmol/L (ref 0.5–1.9)

## 2019-10-25 LAB — CREATININE, SERUM
Creatinine, Ser: 0.79 mg/dL (ref 0.44–1.00)
GFR calc Af Amer: >60 mL/min (ref >60)
GFR calc non Af Amer: >60 mL/min (ref >60)

## 2019-10-25 LAB — TROPONIN I (HIGH SENSITIVITY)
Troponin I (High Sensitivity): 6 ng/L (ref <18)
Troponin I (High Sensitivity): 7 ng/L (ref <18)

## 2019-10-25 LAB — HCG, QUANTITATIVE, PREGNANCY: hCG, Beta Chain, Quant, S: 2 m[IU]/mL (ref <5)

## 2019-10-25 LAB — SARS CORONAVIRUS 2 (TAT 6-24 HRS): SARS Coronavirus 2: NEGATIVE

## 2019-10-25 LAB — POC SARS CORONAVIRUS 2 AG -  ED: SARS Coronavirus 2 Ag: NEGATIVE

## 2019-10-25 LAB — BRAIN NATRIURETIC PEPTIDE: B Natriuretic Peptide: 36.3 pg/mL (ref 0.0–100.0)

## 2019-10-25 MED ORDER — ACETAMINOPHEN 325 MG PO TABS
650.0000 mg | ORAL_TABLET | Freq: Four times a day (QID) | ORAL | Status: DC | PRN
  Administered 2019-10-25 – 2019-10-29 (×6): 650 mg via ORAL
  Filled 2019-10-25 (×7): qty 2

## 2019-10-25 MED ORDER — BUDESONIDE 0.25 MG/2ML IN SUSP
0.2500 mg | Freq: Two times a day (BID) | RESPIRATORY_TRACT | Status: DC
  Administered 2019-10-25 – 2019-10-27 (×4): 0.25 mg via RESPIRATORY_TRACT
  Filled 2019-10-25 (×6): qty 2

## 2019-10-25 MED ORDER — ACETAMINOPHEN 650 MG RE SUPP: 650.0000 mg | Freq: Four times a day (QID) | RECTAL | Status: DC | PRN

## 2019-10-25 MED ORDER — GUAIFENESIN-DM 100-10 MG/5ML PO SYRP
5.0000 mL | ORAL_SOLUTION | ORAL | Status: DC | PRN
  Administered 2019-10-25 – 2019-10-28 (×7): 5 mL via ORAL
  Filled 2019-10-25 (×7): qty 5

## 2019-10-25 MED ORDER — METHYLPREDNISOLONE SODIUM SUCC 125 MG IJ SOLR
125.0000 mg | Freq: Once | INTRAMUSCULAR | Status: AC
  Administered 2019-10-25: 125 mg via INTRAVENOUS
  Filled 2019-10-25: qty 2

## 2019-10-25 MED ORDER — IPRATROPIUM BROMIDE 0.02 % IN SOLN: 0.5000 mg | RESPIRATORY_TRACT | Status: DC

## 2019-10-25 MED ORDER — IPRATROPIUM-ALBUTEROL 0.5-2.5 (3) MG/3ML IN SOLN
3.0000 mL | RESPIRATORY_TRACT | Status: DC
  Administered 2019-10-25 – 2019-10-26 (×6): 3 mL via RESPIRATORY_TRACT
  Filled 2019-10-25 (×6): qty 3

## 2019-10-25 MED ORDER — ALBUTEROL SULFATE (2.5 MG/3ML) 0.083% IN NEBU: 2.5000 mg | INHALATION_SOLUTION | RESPIRATORY_TRACT | Status: DC

## 2019-10-25 MED ORDER — SODIUM CHLORIDE 0.9 % IV BOLUS
500.0000 mL | Freq: Once | INTRAVENOUS | Status: AC
  Administered 2019-10-25: 500 mL via INTRAVENOUS

## 2019-10-25 MED ORDER — METHYLPREDNISOLONE SODIUM SUCC 40 MG IJ SOLR
40.0000 mg | Freq: Two times a day (BID) | INTRAMUSCULAR | Status: DC
  Administered 2019-10-25 – 2019-10-28 (×8): 40 mg via INTRAVENOUS
  Filled 2019-10-25 (×9): qty 1

## 2019-10-25 MED ORDER — SODIUM CHLORIDE 0.9 % IV SOLN
500.0000 mg | Freq: Every day | INTRAVENOUS | Status: DC
  Administered 2019-10-25 – 2019-10-28 (×4): 500 mg via INTRAVENOUS
  Filled 2019-10-25 (×4): qty 500

## 2019-10-25 MED ORDER — FENTANYL CITRATE (PF) 100 MCG/2ML IJ SOLN
100.0000 ug | Freq: Once | INTRAMUSCULAR | Status: AC
  Administered 2019-10-25: 100 ug via INTRAVENOUS
  Filled 2019-10-25: qty 2

## 2019-10-25 MED ORDER — ALBUTEROL (5 MG/ML) CONTINUOUS INHALATION SOLN
10.0000 mg/h | INHALATION_SOLUTION | Freq: Once | RESPIRATORY_TRACT | Status: AC
  Administered 2019-10-25: 10 mg/h via RESPIRATORY_TRACT
  Filled 2019-10-25 (×2): qty 20

## 2019-10-25 MED ORDER — IPRATROPIUM-ALBUTEROL 0.5-2.5 (3) MG/3ML IN SOLN
RESPIRATORY_TRACT | Status: AC
  Administered 2019-10-25: 05:00:00
  Filled 2019-10-25: qty 3

## 2019-10-25 MED ORDER — ASPIRIN-ACETAMINOPHEN-CAFFEINE 250-250-65 MG PO TABS
1.0000 | ORAL_TABLET | Freq: Once | ORAL | Status: AC
  Administered 2019-10-25: 1 via ORAL
  Filled 2019-10-25: qty 1

## 2019-10-25 MED ORDER — ONDANSETRON HCL 4 MG PO TABS: 4.0000 mg | ORAL_TABLET | Freq: Four times a day (QID) | ORAL | Status: DC | PRN

## 2019-10-25 MED ORDER — ONDANSETRON HCL 4 MG/2ML IJ SOLN
4.0000 mg | Freq: Four times a day (QID) | INTRAMUSCULAR | Status: DC | PRN
  Administered 2019-10-25: 4 mg via INTRAVENOUS
  Filled 2019-10-25: qty 2

## 2019-10-25 MED ORDER — IPRATROPIUM BROMIDE 0.02 % IN SOLN
0.5000 mg | Freq: Once | RESPIRATORY_TRACT | Status: AC
  Administered 2019-10-25: 0.5 mg via RESPIRATORY_TRACT
  Filled 2019-10-25 (×2): qty 2.5

## 2019-10-25 MED ORDER — ENOXAPARIN SODIUM 40 MG/0.4ML ~~LOC~~ SOLN
40.0000 mg | Freq: Every day | SUBCUTANEOUS | Status: DC
  Administered 2019-10-25 – 2019-10-29 (×5): 40 mg via SUBCUTANEOUS
  Filled 2019-10-25 (×5): qty 0.4

## 2019-10-25 MED ORDER — ALBUTEROL SULFATE (2.5 MG/3ML) 0.083% IN NEBU
2.5000 mg | INHALATION_SOLUTION | RESPIRATORY_TRACT | Status: DC | PRN
  Administered 2019-10-25 – 2019-10-27 (×2): 2.5 mg via RESPIRATORY_TRACT
  Filled 2019-10-25 (×2): qty 3

## 2019-10-25 NOTE — ED Notes (Signed)
Paged admitting MD regarding pt elevated Lactic Acid.

## 2019-10-25 NOTE — Progress Notes (Addendum)
PROGRESS NOTE  Kiara Leach ZOX:[id number] DOB: 08/15/1976 DOA: 10/25/2019 PCP: [facility]  HPI/Recap of past 24 hours: This is a 43 year old female with history of asthma on albuterol as needed who experienced increased shortness of breath with wheezing and rib pain over the last couple of days with increased use of her albuterol more than usual with no improvement she has a nonproductive cough and she denies any fever or chills or any sick contact.  Patient was admitted for acute respiratory failure with hypoxia secondary to asthma exacerbation  Subjective: Patient seen in the ED at bedside she stated that she is having some chills but no fever she still wheezing and short of breath  Assessment/Plan: Active Problems:   Acute respiratory failure with hypoxia (HCC)   Asthma exacerbation  #1 acute respiratory failure.  Patient is currently on nasal cannula O2 saturation is well.  COVID-19 is negative  2.  Acute asthma exacerbation contributing to #1 chest x-ray is clear she does have a mild leukocytosis.  We will continue incentive spirometry and nebulizer treatment as well as IV prednisone  3.  Hypertension no prior history of high blood pressure will monitor.  Will start as needed antihypertensive if it stays elevated  4.  Morbid obesity.  Patient will benefit from reduced calorie diet and weight loss  Code Status: FULL   Severity of Illness: The appropriate patient status for this patient is INPATIENT. Inpatient status is judged to be reasonable and necessary in order to provide the required intensity of service to ensure the patient's safety. The patient's presenting symptoms, physical exam findings, and initial radiographic and laboratory data in the context of their chronic comorbidities is felt to place them at high risk for further clinical deterioration. Furthermore, it is not anticipated that the patient will be medically stable for discharge from the hospital within 2  midnights of admission. The following factors support the patient status of inpatient.   " The patient's presenting symptoms include acute respiratory distress. " The worrisome physical exam findings include bilateral wheezing. " The initial radiographic and laboratory data are worrisome because of acute respiratory distress. " The chronic co-morbidities include morbid obesity/asthma.   * I certify that at the point of admission it is my clinical judgment that the patient will require inpatient hospital care spanning beyond 2 midnights from the point of admission due to high intensity of service, high risk for further deterioration and high frequency of surveillance required.*    Family Communication: None at bedside  Disposition Plan: Remain inpatient due to continued hypoxia and respiratory distress   Consultants:  None  Procedures:  None  Antimicrobials:  Azithromycin  DVT prophylaxis: Lovenox   Objective: Vitals:   10/25/19 1045 10/25/19 1100 10/25/19 1115 10/25/19 1130  BP: (!) 163/107 (!) 153/106 (!) 153/96 (!) 150/101  Pulse: (!) 109 (!) 101 (!) 102 (!) 101  Resp: (!) 21 (!) 23 (!) 27 (!) 24  Temp:      TempSrc:      SpO2: 100% 100% 99% 100%  Weight:      Height:       No intake or output data in the 24 hours ending 10/25/19 1329 Filed Weights   10/25/19 0149  Weight: 130.6 kg   Body mass index is 52.66 kg/m.  Exam:  . General: 43 y.o. year-old female well developed well nourished in no acute distress.  Alert and oriented x3.,obessed . Cardiovascular: Regular rate and rhythm with no rubs or gallops.  No thyromegaly or JVD noted.   Marland Kitchen Respiratory:  auscultation with  wheezes ,and faint rhomchi Good inspiratory effort. . Abdomen: Soft nontender nondistended with normal bowel sounds x4 quadrants. . Musculoskeletal: No lower extremity edema. 2/4 pulses in all 4 extremities. . Skin: No ulcerative lesions noted or rashes, . Psychiatry: Mood is appropriate  for condition and setting    Data Reviewed: CBC: Recent Labs  Lab 10/25/19 0142 10/25/19 0541 10/25/19 0628  WBC 17.2*  --  15.5*  NEUTROABS 12.3*  --   --   HGB 13.4 13.6 13.5  HCT 42.8 40.0 42.8  MCV 75.5*  --  74.8*  PLT 424*  --  527*   Basic Metabolic Panel: Recent Labs  Lab 10/25/19 0251 10/25/19 0541 10/25/19 0628  NA 142 138  --   K 3.8 4.0  --   CL 110  --   --   CO2 22  --   --   GLUCOSE 134*  --   --   BUN 9  --   --   CREATININE 0.79  --  0.79  CALCIUM 9.4  --   --    GFR: Estimated Creatinine Clearance: 117.8 mL/min (by C-G formula based on SCr of 0.79 mg/dL). Liver Function Tests: No results for input(s): AST, ALT, ALKPHOS, BILITOT, PROT, ALBUMIN in the last 168 hours. No results for input(s): LIPASE, AMYLASE in the last 168 hours. No results for input(s): AMMONIA in the last 168 hours. Coagulation Profile: No results for input(s): INR, PROTIME in the last 168 hours. Cardiac Enzymes: No results for input(s): CKTOTAL, CKMB, CKMBINDEX, TROPONINI in the last 168 hours. BNP (last 3 results) No results for input(s): PROBNP in the last 8760 hours. HbA1C: No results for input(s): HGBA1C in the last 72 hours. CBG: No results for input(s): GLUCAP in the last 168 hours. Lipid Profile: No results for input(s): CHOL, HDL, LDLCALC, TRIG, CHOLHDL, LDLDIRECT in the last 72 hours. Thyroid Function Tests: No results for input(s): TSH, T4TOTAL, FREET4, T3FREE, THYROIDAB in the last 72 hours. Anemia Panel: No results for input(s): VITAMINB12, FOLATE, FERRITIN, TIBC, IRON, RETICCTPCT in the last 72 hours. Urine analysis:    Component Value Date/Time   COLORURINE YELLOW 12/20/2016 2041   APPEARANCEUR CLEAR 12/20/2016 2041   LABSPEC 1.015 12/20/2016 2041   PHURINE 6.0 12/20/2016 2041   GLUCOSEU NEGATIVE 12/20/2016 2041   HGBUR SMALL (A) 12/20/2016 2041   BILIRUBINUR NEGATIVE 12/20/2016 2041   KETONESUR 15 (A) 12/20/2016 2041   PROTEINUR NEGATIVE 12/20/2016  2041   NITRITE POSITIVE (A) 12/20/2016 2041   LEUKOCYTESUR SMALL (A) 12/20/2016 2041   Sepsis Labs: @LABRCNTIP (procalcitonin:4,lacticidven:4)  ) Recent Results (from the past 240 hour(s))  SARS CORONAVIRUS 2 (TAT 6-24 HRS) Nasopharyngeal Nasopharyngeal Swab     Status: None   Collection Time: 10/25/19  3:59 AM   Specimen: Nasopharyngeal Swab  Result Value Ref Range Status   SARS Coronavirus 2 NEGATIVE NEGATIVE Final    Comment: (NOTE) SARS-CoV-2 target nucleic acids are NOT DETECTED. The SARS-CoV-2 RNA is generally detectable in upper and lower respiratory specimens during the acute phase of infection. Negative results do not preclude SARS-CoV-2 infection, do not rule out co-infections with other pathogens, and should not be used as the sole basis for treatment or other patient management decisions. Negative results must be combined with clinical observations, patient history, and epidemiological information. The expected result is Negative. Fact Sheet for Patients: SugarRoll.be Fact Sheet for Healthcare Providers: https://www.woods-mathews.com/ This test is not yet  approved or cleared by the [place] and  has been authorized for detection and/or diagnosis of SARS-CoV-2 by FDA under an Emergency Use Authorization (EUA). This EUA will remain  in effect (meaning this test can be used) for the duration of the COVID-19 declaration under Section 56 4(b)(1) of the Act, 21 U.S.C. section 360bbb-3(b)(1), unless the authorization is terminated or revoked sooner. Performed at [facility], [address], [place], [place] [address]       Studies: Dg Chest Port 1 View  Result Date: 10/25/2019 CLINICAL DATA:  Respiratory distress EXAM: PORTABLE CHEST 1 VIEW COMPARISON:  October 18, 2017 FINDINGS: The heart size and mediastinal contours are within normal limits. Both lungs are clear. The visualized skeletal structures are  unremarkable. IMPRESSION: No active disease. Electronically Signed   By: Gerome Sam III M.D   On: 10/25/2019 01:55    Scheduled Meds: . budesonide (PULMICORT) nebulizer solution  0.25 mg Nebulization BID  . enoxaparin (LOVENOX) injection  40 mg Subcutaneous Daily  . ipratropium-albuterol  3 mL Nebulization Q4H  . methylPREDNISolone (SOLU-MEDROL) injection  40 mg Intravenous Q12H    Continuous Infusions: . azithromycin Stopped (10/25/19 0719)     LOS: 0 days     Myrtie Neither, MD [facility]  To reach me or the doctor on call, go to: [email or web address] Password [email or web address]  10/25/2019, 1:29 PM

## 2019-10-25 NOTE — ED Provider Notes (Addendum)
[facility] EMERGENCY DEPARTMENT Provider Note   CSN: [id number] Arrival date & time: 10/25/19  0139     History   Chief Complaint Chief Complaint  Patient presents with  . Respiratory Distress   Level 5 caveat due to acuity of condition HPI Kiara Leach is a 43 y.o. female.     The history is provided by the patient and the EMS personnel. The history is limited by the condition of the patient.  Shortness of Breath Severity:  Severe Onset quality:  Gradual Duration:  5 days Timing:  Constant Progression:  Worsening Chronicity:  New Relieved by:  Nothing Worsened by:  Nothing Associated symptoms: cough   Patient with history of asthma and obesity presents with shortness of breath.  She has had increasing shortness of breath in the past 5 days with cough.  EMS reports on arrival she was diaphoretic, tachypneic.  She was placed on oxygen and also given albuterol No other details are known at this time  Past Medical History:  Diagnosis Date  . Back pain     Patient Active Problem List   Diagnosis Date Noted  . Ectopic pregnancy, tubal 12/21/2016  . Chest congestion   . CAP (community acquired pneumonia) 03/16/2016  . Sepsis (HCC) 03/16/2016  . Nausea with vomiting 03/16/2016  . Sinus tachycardia 03/16/2016    Past Surgical History:  Procedure Laterality Date  . CESAREAN SECTION    . CHOLECYSTECTOMY    . DIAGNOSTIC LAPAROSCOPY WITH REMOVAL OF ECTOPIC PREGNANCY N/A 12/21/2016   Procedure: DIAGNOSTIC LAPAROSCOPY WITH RIGHT SALPINGECTOMY;  Surgeon: Tilda BurrowJohn Ferguson V, MD;  Location: [facility];  Service: Gynecology;  Laterality: N/A;  . LAPAROSCOPIC LYSIS OF ADHESIONS  12/21/2016   Procedure: LAPAROSCOPIC LYSIS OF ADHESIONS;  Surgeon: Tilda BurrowJohn Ferguson V, MD;  Location: [facility];  Service: Gynecology;;     OB History    Gravida  7   Para  6   Term  6   Preterm      AB      Living  6     SAB      TAB      Ectopic      Multiple      Live Births                Home Medications    Prior to Admission medications   Medication Sig Start Date End Date Taking? Authorizing Provider  chlorpheniramine-phenylephrine (CARDEC) 1-3.5 MG/ML LIQD Take 0.75 mLs by mouth 4 (four) times daily as needed. 10/18/17   Gerhard MunchLockwood, Robert, MD  guaiFENesin-codeine 100-10 MG/5ML syrup Take 5 mLs by mouth 3 (three) times daily as needed for cough. 02/16/18   Janne NapoleonNeese, Hope M, NP  lidocaine (XYLOCAINE) 2 % solution Use as directed 10 mLs in the mouth or throat every 3 (three) hours as needed for mouth pain. 02/16/18   Janne NapoleonNeese, Hope M, NP    Family History No family history on file.  Social History Social History   Tobacco Use  . Smoking status: Former Games developermoker  . Smokeless tobacco: Never Used  Substance Use Topics  . Alcohol use: No    Alcohol/week: 0.0 standard drinks  . Drug use: No     Allergies   Latex, Morphine and related, and Motrin [ibuprofen]   Review of Systems Review of Systems  Unable to perform ROS: Acuity of condition  Respiratory: Positive for cough and shortness of breath.      Physical Exam Updated Vital Signs BP Marland Kitchen(!)  154/94   Pulse (!) 117   Temp 97.7 F (36.5 C) (Oral)   Resp (!) 25   Ht 1.575 m (5\' 2" )   Wt 130.6 kg   BMI 52.66 kg/m   Physical Exam CONSTITUTIONAL: Distress noted HEAD: Normocephalic/atraumatic EYES: EOMI/PERRL ENMT: Mucous membranes moist NECK: supple no meningeal signs SPINE/BACK:entire spine nontender CV: Tachycardic LUNGS: Wheezing bilaterally, distress noted ABDOMEN: soft, nontender, obese GU:no cva tenderness NEURO: Pt is awake/alert/appropriate, moves all extremitiesx4.  No facial droop.   EXTREMITIES: pulses normal/equal, full ROM, lower extremity edema noted bilaterally SKIN: warm, color normal PSYCH: Anxious   ED Treatments / Results  Labs (all labs ordered are listed, but only abnormal results are displayed) Labs Reviewed  CBC WITH DIFFERENTIAL/PLATELET - Abnormal; Notable for  the following components:      Result Value   WBC 17.2 (*)    RBC 5.67 (*)    MCV 75.5 (*)    MCH 23.6 (*)    RDW 16.1 (*)    Platelets 424 (*)    Neutro Abs 12.3 (*)    Monocytes Absolute 1.4 (*)    Eosinophils Absolute 1.1 (*)    Basophils Absolute 0.2 (*)    All other components within normal limits  BASIC METABOLIC PANEL - Abnormal; Notable for the following components:   Glucose, Bld 134 (*)    All other components within normal limits  SARS CORONAVIRUS 2 (TAT 6-24 HRS)  BRAIN NATRIURETIC PEPTIDE  BASIC METABOLIC PANEL  POC SARS CORONAVIRUS 2 AG -  ED    EKG EKG Interpretation  Date/Time:  Saturday October 25 2019 03:54:44 EST Ventricular Rate:  116 PR Interval:    QRS Duration: 68 QT Interval:  347 QTC Calculation: 482 R Axis:   28 Text Interpretation: Sinus tachycardia RSR' in V1 or V2, probably normal variant Borderline T abnormalities, diffuse leads Interpretation limited secondary to artifact Confirmed by [id number] (Zadie Rhine) on 10/25/2019 3:57:22 AM   Radiology Dg Chest Port 1 View  Result Date: 10/25/2019 CLINICAL DATA:  Respiratory distress EXAM: PORTABLE CHEST 1 VIEW COMPARISON:  October 18, 2017 FINDINGS: The heart size and mediastinal contours are within normal limits. Both lungs are clear. The visualized skeletal structures are unremarkable. IMPRESSION: No active disease. Electronically Signed   By: October 20, 2017 III M.D   On: 10/25/2019 01:55    Procedures .Critical Care Performed by: 14/03/2019, MD Authorized by: Zadie Rhine, MD   Critical care provider statement:    Critical care time (minutes):  45   Critical care start time:  10/25/2019 2:00 AM   Critical care end time:  10/25/2019 2:45 AM   Critical care time was exclusive of:  Separately billable procedures and treating other patients   Critical care was necessary to treat or prevent imminent or life-threatening deterioration of the following conditions:  Respiratory failure    Critical care was time spent personally by me on the following activities:  Ordering and review of radiographic studies, ordering and review of laboratory studies, pulse oximetry, re-evaluation of patient's condition, evaluation of patient's response to treatment and examination of patient   I assumed direction of critical care for this patient from another provider in my specialty: no      Medications Ordered in ED Medications  fentaNYL (SUBLIMAZE) injection 100 mcg (has no administration in time range)  albuterol (PROVENTIL,VENTOLIN) solution continuous neb (10 mg/hr Nebulization Given 10/25/19 0300)  ipratropium (ATROVENT) nebulizer solution 0.5 mg (0.5 mg Nebulization Given 10/25/19 0259)  methylPREDNISolone  sodium succinate (SOLU-MEDROL) 125 mg/2 mL injection 125 mg (125 mg Intravenous Given 10/25/19 0258)     Initial Impression / Assessment and Plan / ED Course  I have reviewed the triage vital signs and the nursing notes.  Pertinent labs & imaging results that were available during my care of the patient were reviewed by me and considered in my medical decision making (see chart for details).        2:06 AM Patient seen on arrival she was in respiratory distress.  She is wheezing bilaterally She had some improvement with nonrebreather.  X-ray is negative.  Rapid Covid is pending.  Patient will likely require BiPAP 2:56 AM Patient appears to be improving.  Chest x-ray is negative.  Covid negative.  Suspect this is severe asthma exacerbation Nebulizers and steroids been ordered.  Patient require admission 3:58 AM Patient appears to be improving.  She has been tolerating nebulized therapies.  Work of breathing is improved. She will need to be admitted to the hospital. Hospitalist has been consulted for admission.  Kendra Grissett was evaluated in Emergency Department on 10/25/2019 for the symptoms described in the history of present illness. She was evaluated in the context of the  global COVID-19 pandemic, which necessitated consideration that the patient might be at risk for infection with the SARS-CoV-2 virus that causes COVID-19. Institutional protocols and algorithms that pertain to the evaluation of patients at risk for COVID-19 are in a state of rapid change based on information released by regulatory bodies including the CDC and federal and state organizations. These policies and algorithms were followed during the patient's care in the ED.  Final Clinical Impressions(s) / ED Diagnoses   Final diagnoses:  Severe persistent asthma with status asthmaticus  Acute respiratory failure, unspecified whether with hypoxia or hypercapnia [facility])    ED Discharge Orders    None       Ripley Fraise, MD 10/25/19 0300    Ripley Fraise, MD 10/25/19 [phone]

## 2019-10-25 NOTE — H&P (Addendum)
History and Physical    Kiara Leach GUY:[id number] DOB: September 12, 1976 DOA: 10/25/2019  PCP: [facility]  Patient coming from: Home.  Chief Complaint: Shortness of breath.  HPI: Kiara Leach is a 43 y.o. female with history of asthma uses as needed albuterol has been having increasing shortness of breath with wheezing and rib pain over the last couple of days.  Patient states she has been using albuterol despite which patient was still wheezing.  Has no productive cough fever or chills.  No sick contacts.  ED Course: In the ER patient was diffusely wheezing and patient is finding difficulty to complete sentences.  Chest x-ray was unremarkable EKG shows sinus tachycardia lab work showed leukocytosis of 17,000.  Point-of-care Covid test was negative patient was placed on nebulizer after which.  BNP was 36.3.  Patient admitted for acute respiratory failure with hypoxia secondary to asthma exacerbation.  Review of Systems: As per HPI, rest all negative.   Past Medical History:  Diagnosis Date  . Asthma   . Back pain     Past Surgical History:  Procedure Laterality Date  . CESAREAN SECTION    . CHOLECYSTECTOMY    . DIAGNOSTIC LAPAROSCOPY WITH REMOVAL OF ECTOPIC PREGNANCY N/A 12/21/2016   Procedure: DIAGNOSTIC LAPAROSCOPY WITH RIGHT SALPINGECTOMY;  Surgeon: Jonnie Kind, MD;  Location: [place] [facility];  Service: Gynecology;  Laterality: N/A;  . LAPAROSCOPIC LYSIS OF ADHESIONS  12/21/2016   Procedure: LAPAROSCOPIC LYSIS OF ADHESIONS;  Surgeon: Jonnie Kind, MD;  Location: [facility];  Service: Gynecology;;     reports that she has quit smoking. She has never used smokeless tobacco. She reports that she does not drink alcohol or use drugs.  Allergies  Allergen Reactions  . Latex Itching  . Morphine And Related Hives and Itching    Burning on skin  . Motrin [Ibuprofen] Hives and Itching    Burning on skin     Family History  Problem Relation Age of Onset  . Asthma Brother     Prior to  Admission medications   Not on File    Physical Exam: Constitutional: Moderately built and nourished. Vitals:   10/25/19 0345 10/25/19 0400 10/25/19 0430 10/25/19 0528  BP: (!) 154/107 (!) 156/119    Pulse: (!) 111 (!) 141 (!) 107   Resp: (!) 30 19 (!) 23   Temp:      TempSrc:      SpO2: 100% 98% 98% 100%  Weight:      Height:       Eyes: Anicteric no pallor. ENMT: No discharge from the ears eyes nose or mouth. Neck: No masses.  No neck rigidity no JVD appreciated. Respiratory: Bilateral expiratory wheeze and no crepitations. Cardiovascular: S1-S2 heard. Abdomen: Soft nontender bowel sounds present. Musculoskeletal: No edema. Skin: No rash. Neurologic: Alert awake oriented to time place and person.  Moves all extremities. Psychiatric: Appears normal.  Normal affect.   Labs on Admission: I have personally reviewed following labs and imaging studies  CBC: Recent Labs  Lab 10/25/19 0142  WBC 17.2*  NEUTROABS 12.3*  HGB 13.4  HCT 42.8  MCV 75.5*  PLT 563*   Basic Metabolic Panel: Recent Labs  Lab 10/25/19 0251  NA 142  K 3.8  CL 110  CO2 22  GLUCOSE 134*  BUN 9  CREATININE 0.79  CALCIUM 9.4   GFR: Estimated Creatinine Clearance: 117.8 mL/min (by C-G formula based on SCr of 0.79 mg/dL). Liver Function Tests: No results for input(s):  AST, ALT, ALKPHOS, BILITOT, PROT, ALBUMIN in the last 168 hours. No results for input(s): LIPASE, AMYLASE in the last 168 hours. No results for input(s): AMMONIA in the last 168 hours. Coagulation Profile: No results for input(s): INR, PROTIME in the last 168 hours. Cardiac Enzymes: No results for input(s): CKTOTAL, CKMB, CKMBINDEX, TROPONINI in the last 168 hours. BNP (last 3 results) No results for input(s): PROBNP in the last 8760 hours. HbA1C: No results for input(s): HGBA1C in the last 72 hours. CBG: No results for input(s): GLUCAP in the last 168 hours. Lipid Profile: No results for input(s): CHOL, HDL, LDLCALC,  TRIG, CHOLHDL, LDLDIRECT in the last 72 hours. Thyroid Function Tests: No results for input(s): TSH, T4TOTAL, FREET4, T3FREE, THYROIDAB in the last 72 hours. Anemia Panel: No results for input(s): VITAMINB12, FOLATE, FERRITIN, TIBC, IRON, RETICCTPCT in the last 72 hours. Urine analysis:    Component Value Date/Time   COLORURINE YELLOW 12/20/2016 2041   APPEARANCEUR CLEAR 12/20/2016 2041   LABSPEC 1.015 12/20/2016 2041   PHURINE 6.0 12/20/2016 2041   GLUCOSEU NEGATIVE 12/20/2016 2041   HGBUR SMALL (A) 12/20/2016 2041   BILIRUBINUR NEGATIVE 12/20/2016 2041   KETONESUR 15 (A) 12/20/2016 2041   PROTEINUR NEGATIVE 12/20/2016 2041   NITRITE POSITIVE (A) 12/20/2016 2041   LEUKOCYTESUR SMALL (A) 12/20/2016 2041   Sepsis Labs: @LABRCNTIP (procalcitonin:4,lacticidven:4) )No results found for this or any previous visit (from the past 240 hour(s)).   Radiological Exams on Admission: Dg Chest Port 1 View  Result Date: 10/25/2019 CLINICAL DATA:  Respiratory distress EXAM: PORTABLE CHEST 1 VIEW COMPARISON:  October 18, 2017 FINDINGS: The heart size and mediastinal contours are within normal limits. Both lungs are clear. The visualized skeletal structures are unremarkable. IMPRESSION: No active disease. Electronically Signed   By: October 20, 2017 III M.D   On: 10/25/2019 01:55    EKG: Independently reviewed.  Sinus tachycardia.  Assessment/Plan Active Problems:   Acute respiratory failure with hypoxia (HCC)   Asthma exacerbation    1. Acute respiratory failure with hypoxia secondary sinus congestion for which patient is on IV Solu-Medrol Pulmicort nebulizer.  I have ordered ABG.  Follow PCO2 levels.  Will monitor in stepdown unit.  Start Zithromax since patient has productive cough. 2. Elevated blood pressure reading will closely monitor blood pressure trends. 3. Leukocytosis probably reactionary.  Patient is afebrile.  Chest x-ray does not show any infiltrate.  Continue to monitor.  Start  Zithromax since patient has productive cough.  Covid PCR is pending.  Given the acute respiratory failure patient will need more than 2 midnight stay in inpatient status.  DVT prophylaxis: Lovenox. Code Status: Full code. Family Communication: Discussed with patient. Disposition Plan: Home. Consults called: None. Admission status: Inpatient.   14/03/2019 MD [facility] Pager [phone].  If 7PM-7AM, please contact night-coverage [email or web address] Password [facility]  10/25/2019, 5:37 AM

## 2019-10-25 NOTE — ED Triage Notes (Signed)
Pt brought to ED by EMS from home for respiratory distress, pt having SOB and not productive cough for the past 5 days, no hx asthma. BP 164/112, HR 120, SPO2 100% on NRM 90%  On RA.

## 2019-10-25 NOTE — ED Notes (Signed)
SDU ordered bfast 

## 2019-10-25 NOTE — ED Notes (Signed)
ED TO INPATIENT HANDOFF REPORT  ED Nurse Name and Phone #: Danille Oppedisano [phone]  S Name/Age/Gender Kiara Leach 43 y.o. female Room/Bed: 056C/056C  Code Status   Code Status: Full Code  Home/SNF/Other Home Patient oriented to: self, place, time and situation Is this baseline? Yes   Triage Complete: Triage complete  Chief Complaint Resp. distress  Triage Note Pt brought to ED by EMS from home for respiratory distress, pt having SOB and not productive cough for the past 5 days, no hx asthma. BP 164/112, HR 120, SPO2 100% on NRM 90%  On RA.   Allergies Allergies  Allergen Reactions  . Latex Itching  . Morphine And Related Hives and Itching  . Ibuprofen Hives, Itching and Rash    Level of Care/Admitting Diagnosis ED Disposition    ED Disposition Condition [facility] Area: [place] [[id number]]  Level of Care: Progressive [102]  Admit to Progressive based on following criteria: RESPIRATORY PROBLEMS hypoxemic/hypercapnic respiratory failure that is responsive to NIPPV (BiPAP) or High Flow Nasal Cannula (6-80 lpm). Frequent assessment/intervention, no > Q2 hrs < Q4 hrs, to maintain oxygenation and pulmonary hygiene.  Covid Evaluation: Asymptomatic Screening Protocol (No Symptoms)  Diagnosis: Acute respiratory failure with hypoxia [facility]) [[id number]]  Admitting Physician: Rise Patience [phone]  Attending Physician: Rise Patience [phone]  Estimated length of stay: past midnight tomorrow  Certification:: I certify this patient will need inpatient services for at least 2 midnights  PT Class (Do Not Modify): Inpatient [101]  PT Acc Code (Do Not Modify): Private [1]       B Medical/Surgery History Past Medical History:  Diagnosis Date  . Asthma   . Back pain    Past Surgical History:  Procedure Laterality Date  . CESAREAN SECTION    . CHOLECYSTECTOMY    . DIAGNOSTIC LAPAROSCOPY WITH REMOVAL OF ECTOPIC PREGNANCY N/A 12/21/2016   Procedure: DIAGNOSTIC LAPAROSCOPY WITH RIGHT SALPINGECTOMY;  Surgeon: Jonnie Kind, MD;  Location: [facility];  Service: Gynecology;  Laterality: N/A;  . LAPAROSCOPIC LYSIS OF ADHESIONS  12/21/2016   Procedure: LAPAROSCOPIC LYSIS OF ADHESIONS;  Surgeon: Jonnie Kind, MD;  Location: [facility];  Service: Gynecology;;     A IV Location/Drains/Wounds Patient Lines/Drains/Airways Status   Active Line/Drains/Airways    Name:   Placement date:   Placement time:   Site:   Days:   Peripheral IV 10/25/19 Right Forearm   10/25/19    0209    Forearm   less than 1   Peripheral IV 10/25/19 Left;Upper Arm   10/25/19    0213    Arm   less than 1   Incision (Closed) 12/21/16 Vagina   12/21/16    0138     1038   Incision - 3 Ports Abdomen Medial;Lower Right;Upper Umbilicus   00/17/49    4496     1038          Intake/Output Last 24 hours No intake or output data in the 24 hours ending 10/25/19 1627  Labs/Imaging Results for orders placed or performed during the hospital encounter of 10/25/19 (from the past 48 hour(s))  CBC with Differential     Status: Abnormal   Collection Time: 10/25/19  1:42 AM  Result Value Ref Range   WBC 17.2 (H) 4.0 - 10.5 K/uL   RBC 5.67 (H) 3.87 - 5.11 MIL/uL   Hemoglobin 13.4 12.0 - 15.0 g/dL   HCT 42.8 36.0 - 46.0 %   MCV  75.5 (L) 80.0 - 100.0 fL   MCH 23.6 (L) 26.0 - 34.0 pg   MCHC 31.3 30.0 - 36.0 g/dL   RDW 16.1 (H) 11.5 - 15.5 %   Platelets 424 (H) 150 - 400 K/uL   nRBC 0.0 0.0 - 0.2 %   Neutrophils Relative % 71 %   Neutro Abs 12.3 (H) 1.7 - 7.7 K/uL   Lymphocytes Relative 13 %   Lymphs Abs 2.2 0.7 - 4.0 K/uL   Monocytes Relative 8 %   Monocytes Absolute 1.4 (H) 0.1 - 1.0 K/uL   Eosinophils Relative 7 %   Eosinophils Absolute 1.1 (H) 0.0 - 0.5 K/uL   Basophils Relative 1 %   Basophils Absolute 0.2 (H) 0.0 - 0.1 K/uL   Immature Granulocytes 0 %   Abs Immature Granulocytes 0.07 0.00 - 0.07 K/uL    Comment: Performed at [facility] [address], [place], [place] [address]  Brain natriuretic peptide     Status: None   Collection Time: 10/25/19  1:42 AM  Result Value Ref Range   B Natriuretic Peptide 36.3 0.0 - 100.0 pg/mL    Comment: Performed at [facility] [address], [place], [place] [address]  POC SARS Coronavirus 2 Ag-ED - Nasal Swab (BD Veritor Kit)     Status: None   Collection Time: 10/25/19  2:45 AM  Result Value Ref Range   SARS Coronavirus 2 Ag NEGATIVE NEGATIVE    Comment: (NOTE) SARS-CoV-2 antigen NOT DETECTED.  Negative results are presumptive.  Negative results do not preclude SARS-CoV-2 infection and should not be used as the sole basis for treatment or other patient management decisions, including infection  control decisions, particularly in the presence of clinical signs and  symptoms consistent with COVID-19, or in those who have been in contact with the virus.  Negative results must be combined with clinical observations, patient history, and epidemiological information. The expected result is Negative. Fact Sheet for Patients: PodPark.tn Fact Sheet for Healthcare Providers: GiftContent.is This test is not yet approved or cleared by the [place] FDA and  has been authorized for detection and/or diagnosis of SARS-CoV-2 by FDA under an Emergency Use Authorization (EUA).  This EUA will remain in effect (meaning this test can be used) for the duration of  the COVID-19 de claration under Section 564(b)(1) of the Act, 21 U.S.C. section 360bbb-3(b)(1), unless the authorization is terminated or revoked sooner.   Basic metabolic panel     Status: Abnormal   Collection Time: 10/25/19  2:51 AM  Result Value Ref Range   Sodium 142 135 - 145 mmol/L   Potassium 3.8 3.5 - 5.1 mmol/L   Chloride 110 98 - 111 mmol/L   CO2 22 22 - 32 mmol/L   Glucose, Bld 134 (H) 70 - 99 mg/dL   BUN 9 6 - 20 mg/dL   Creatinine, Ser 0.79 0.44 - 1.00 mg/dL    Calcium 9.4 8.9 - 10.3 mg/dL   GFR calc non Af Amer >60 >60 mL/min   GFR calc Af Amer >60 >60 mL/min   Anion gap 10 5 - 15    Comment: Performed at [facility], [place] [address], [place], [place] [address]  hCG, quantitative, pregnancy     Status: None   Collection Time: 10/25/19  2:51 AM  Result Value Ref Range   hCG, Beta Chain, Quant, S 2 <5 mIU/mL    Comment:  GEST. AGE      CONC.  (mIU/mL)   <=1 WEEK        5 - 50     2 WEEKS       50 - 500     3 WEEKS       100 - 10,000     4 WEEKS     1,000 - 30,000     5 WEEKS     3,500 - 115,000   6-8 WEEKS     12,000 - 270,000    12 WEEKS     15,000 - 220,000        FEMALE AND NON-PREGNANT FEMALE:     LESS THAN 5 mIU/mL Performed at [facility], [place] [address] [place], [place] [address]   SARS CORONAVIRUS 2 (TAT 6-24 HRS) Nasopharyngeal Nasopharyngeal Swab     Status: None   Collection Time: 10/25/19  3:59 AM   Specimen: Nasopharyngeal Swab  Result Value Ref Range   SARS Coronavirus 2 NEGATIVE NEGATIVE    Comment: (NOTE) SARS-CoV-2 target nucleic acids are NOT DETECTED. The SARS-CoV-2 RNA is generally detectable in upper and lower respiratory specimens during the acute phase of infection. Negative results do not preclude SARS-CoV-2 infection, do not rule out co-infections with other pathogens, and should not be used as the sole basis for treatment or other patient management decisions. Negative results must be combined with clinical observations, patient history, and epidemiological information. The expected result is Negative. Fact Sheet for Patients: SugarRoll.be Fact Sheet for Healthcare Providers: https://www.woods-mathews.com/ This test is not yet approved or cleared by the [place] FDA and  has been authorized for detection and/or diagnosis of SARS-CoV-2 by FDA under an Emergency Use Authorization (EUA). This EUA will remain  in effect (meaning this test can  be used) for the duration of the COVID-19 declaration under Section 56 4(b)(1) of the Act, 21 U.S.C. section 360bbb-3(b)(1), unless the authorization is terminated or revoked sooner. Performed at [facility], [place] [address] [place], [place] [address]   I-STAT 7, (LYTES, BLD GAS, ICA, H+H)     Status: Abnormal   Collection Time: 10/25/19  5:41 AM  Result Value Ref Range   pH, Arterial 7.325 (L) 7.350 - 7.450   pCO2 arterial 40.1 32.0 - 48.0 mmHg   pO2, Arterial 109.0 (H) 83.0 - 108.0 mmHg   Bicarbonate 21.0 20.0 - 28.0 mmol/L   TCO2 22 22 - 32 mmol/L   O2 Saturation 98.0 %   Acid-base deficit 5.0 (H) 0.0 - 2.0 mmol/L   Sodium 138 135 - 145 mmol/L   Potassium 4.0 3.5 - 5.1 mmol/L   Calcium, Ion 1.28 1.15 - 1.40 mmol/L   HCT 40.0 36.0 - 46.0 %   Hemoglobin 13.6 12.0 - 15.0 g/dL   Patient temperature 97.7 F    Collection site RADIAL, ALLEN'S TEST ACCEPTABLE    Drawn by RT    Sample type ARTERIAL   CBC     Status: Abnormal   Collection Time: 10/25/19  6:28 AM  Result Value Ref Range   WBC 15.5 (H) 4.0 - 10.5 K/uL   RBC 5.72 (H) 3.87 - 5.11 MIL/uL   Hemoglobin 13.5 12.0 - 15.0 g/dL   HCT 42.8 36.0 - 46.0 %   MCV 74.8 (L) 80.0 - 100.0 fL   MCH 23.6 (L) 26.0 - 34.0 pg   MCHC 31.5 30.0 - 36.0 g/dL   RDW 16.1 (H) 11.5 - 15.5 %  Platelets 420 (H) 150 - 400 K/uL   nRBC 0.0 0.0 - 0.2 %    Comment: Performed at [facility], [place] [address], [place], [place] [address]  Creatinine, serum     Status: None   Collection Time: 10/25/19  6:28 AM  Result Value Ref Range   Creatinine, Ser 0.79 0.44 - 1.00 mg/dL   GFR calc non Af Amer >60 >60 mL/min   GFR calc Af Amer >60 >60 mL/min    Comment: Performed at [facility] [address] [place], [place] [address]  Troponin I (High Sensitivity)     Status: None   Collection Time: 10/25/19  6:28 AM  Result Value Ref Range   Troponin I (High Sensitivity) 6 <18 ng/L    Comment: (NOTE) Elevated high sensitivity troponin I  (hsTnI) values and significant  changes across serial measurements may suggest ACS but many other  chronic and acute conditions are known to elevate hsTnI results.  Refer to the "Links" section for chest pain algorithms and additional  guidance. Performed at [facility], [place] [address] [place], [place] [address]   Lactic acid, plasma     Status: Abnormal   Collection Time: 10/25/19  6:28 AM  Result Value Ref Range   Lactic Acid, Venous 2.4 (HH) 0.5 - 1.9 mmol/L    Comment: CRITICAL RESULT CALLED TO, READ BACK BY AND VERIFIED WITH: C.BAIN,RN @ 0947 10/25/2019 WEBBERJ Performed at [facility] [address], [place], [place] [address]   Troponin I (High Sensitivity)     Status: None   Collection Time: 10/25/19  7:38 AM  Result Value Ref Range   Troponin I (High Sensitivity) 7 <18 ng/L    Comment: (NOTE) Elevated high sensitivity troponin I (hsTnI) values and significant  changes across serial measurements may suggest ACS but many other  chronic and acute conditions are known to elevate hsTnI results.  Refer to the "Links" section for chest pain algorithms and additional  guidance. Performed at [facility], [place] [address], [place], [place] [address]   Lactic acid, plasma     Status: None   Collection Time: 10/25/19  2:30 PM  Result Value Ref Range   Lactic Acid, Venous 1.2 0.5 - 1.9 mmol/L    Comment: Performed at [facility] [address] [place], [place] [address]   Dg Chest Port 1 View  Result Date: 10/25/2019 CLINICAL DATA:  Respiratory distress EXAM: PORTABLE CHEST 1 VIEW COMPARISON:  October 18, 2017 FINDINGS: The heart size and mediastinal contours are within normal limits. Both lungs are clear. The visualized skeletal structures are unremarkable. IMPRESSION: No active disease. Electronically Signed   By: Dorise Bullion III M.D   On: 10/25/2019 01:55    Pending Labs Unresulted Labs (From admission, onward)    Start     Ordered    11/01/19 0500  Creatinine, serum  (enoxaparin (LOVENOX)    CrCl >/= 30 ml/min)  Weekly,   R    Comments: while on enoxaparin therapy    10/25/19 0537   10/26/19 0500  HIV Antibody (routine testing w rflx)  (HIV Antibody (Routine testing w reflex) panel)  Tomorrow morning,   R     10/25/19 0537   10/26/19 6503  Basic metabolic panel  Tomorrow morning,   R     10/25/19 0537   10/26/19 0500  CBC  Tomorrow morning,   R     10/25/19  2518   10/25/19 0520  Blood gas, arterial  ONCE - STAT,   R     10/25/19 0519          Vitals/Pain Today's Vitals   10/25/19 1300 10/25/19 1315 10/25/19 1330 10/25/19 1608  BP: (!) 144/94 (!) 156/100 (!) 157/103 127/71  Pulse: 92 (!) 101 91 (!) 102  Resp: (!) 24 (!) 24 (!) 22 (!) 23  Temp:      TempSrc:      SpO2: 100% 98% 100% 98%  Weight:      Height:      PainSc:        Isolation Precautions No active isolations  Medications Medications  acetaminophen (TYLENOL) tablet 650 mg (has no administration in time range)    Or  acetaminophen (TYLENOL) suppository 650 mg (has no administration in time range)  ondansetron (ZOFRAN) tablet 4 mg (has no administration in time range)    Or  ondansetron (ZOFRAN) injection 4 mg (has no administration in time range)  enoxaparin (LOVENOX) injection 40 mg (40 mg Subcutaneous Given 10/25/19 1042)  albuterol (PROVENTIL) (2.5 MG/3ML) 0.083% nebulizer solution 2.5 mg (2.5 mg Nebulization Given 10/25/19 0937)  budesonide (PULMICORT) nebulizer solution 0.25 mg (0.25 mg Nebulization Not Given 10/25/19 1016)  methylPREDNISolone sodium succinate (SOLU-MEDROL) 40 mg/mL injection 40 mg (40 mg Intravenous Given 10/25/19 1039)  azithromycin (ZITHROMAX) 500 mg in sodium chloride 0.9 % 250 mL IVPB (0 mg Intravenous Stopped 10/25/19 0719)  ipratropium-albuterol (DUONEB) 0.5-2.5 (3) MG/3ML nebulizer solution 3 mL (3 mLs Nebulization Given 10/25/19 1326)  albuterol (PROVENTIL,VENTOLIN) solution continuous neb (10 mg/hr Nebulization  Given 10/25/19 0300)  ipratropium (ATROVENT) nebulizer solution 0.5 mg (0.5 mg Nebulization Given 10/25/19 0259)  methylPREDNISolone sodium succinate (SOLU-MEDROL) 125 mg/2 mL injection 125 mg (125 mg Intravenous Given 10/25/19 0258)  fentaNYL (SUBLIMAZE) injection 100 mcg (100 mcg Intravenous Given 10/25/19 0400)  ipratropium-albuterol (DUONEB) 0.5-2.5 (3) MG/3ML nebulizer solution (  Given 10/25/19 0528)  sodium chloride 0.9 % bolus 500 mL (0 mLs Intravenous Stopped 10/25/19 9842)    Mobility walks with person assist Moderate fall risk   Focused Assessments Pulmonary Assessment Handoff:  Lung sounds: Bilateral Breath Sounds: Expiratory wheezes L Breath Sounds: Inspiratory wheezes R Breath Sounds: Inspiratory wheezes O2 Device: Aerosol Mask O2 Flow Rate (L/min): 8 L/min      R Recommendations: See Admitting Provider Note  Report given to:   Additional Notes:

## 2019-10-26 DIAGNOSIS — J4541 Moderate persistent asthma with (acute) exacerbation: Secondary | ICD-10-CM

## 2019-10-26 LAB — BASIC METABOLIC PANEL
Anion gap: 12 (ref 5–15)
BUN: 7 mg/dL (ref 6–20)
CO2: 22 mmol/L (ref 22–32)
Calcium: 9.6 mg/dL (ref 8.9–10.3)
Chloride: 106 mmol/L (ref 98–111)
Creatinine, Ser: 0.73 mg/dL (ref 0.44–1.00)
GFR calc Af Amer: >60 mL/min (ref >60)
GFR calc non Af Amer: >60 mL/min (ref >60)
Glucose, Bld: 145 mg/dL — ABNORMAL HIGH (ref 70–99)
Potassium: 5.2 mmol/L — ABNORMAL HIGH (ref 3.5–5.1)
Sodium: 140 mmol/L (ref 135–145)

## 2019-10-26 LAB — CBC
HCT: 39.5 % (ref 36.0–46.0)
Hemoglobin: 12.8 g/dL (ref 12.0–15.0)
MCH: 24 pg — ABNORMAL LOW (ref 26.0–34.0)
MCHC: 32.4 g/dL (ref 30.0–36.0)
MCV: 74 fL — ABNORMAL LOW (ref 80.0–100.0)
Platelets: 423 10*3/uL — ABNORMAL HIGH (ref 150–400)
RBC: 5.34 MIL/uL — ABNORMAL HIGH (ref 3.87–5.11)
RDW: 16.2 % — ABNORMAL HIGH (ref 11.5–15.5)
WBC: 19.4 10*3/uL — ABNORMAL HIGH (ref 4.0–10.5)
nRBC: 0 % (ref 0.0–0.2)

## 2019-10-26 LAB — HIV ANTIBODY (ROUTINE TESTING W REFLEX): HIV Screen 4th Generation wRfx: NONREACTIVE

## 2019-10-26 MED ORDER — BENZONATATE 100 MG PO CAPS
100.0000 mg | ORAL_CAPSULE | Freq: Two times a day (BID) | ORAL | Status: DC
  Administered 2019-10-26 – 2019-10-29 (×7): 100 mg via ORAL
  Filled 2019-10-26 (×7): qty 1

## 2019-10-26 MED ORDER — IPRATROPIUM-ALBUTEROL 0.5-2.5 (3) MG/3ML IN SOLN
3.0000 mL | Freq: Four times a day (QID) | RESPIRATORY_TRACT | Status: DC
  Administered 2019-10-26 – 2019-10-29 (×12): 3 mL via RESPIRATORY_TRACT
  Filled 2019-10-26 (×13): qty 3

## 2019-10-26 NOTE — Progress Notes (Signed)
PROGRESS NOTE  Collier Flowersisha Aldea GNF:[id number] DOB: 02/11/1976 DOA: 10/25/2019 PCP: [facility]  HPI/Recap of past 24 hours: This is a 43 year old female with history of asthma on albuterol as needed who experienced increased shortness of breath with wheezing and rib pain over the last couple of days with increased use of her albuterol more than usual with no improvement she has a nonproductive cough and she denies any fever or chills or any sick contact.  Patient was admitted for acute respiratory failure with hypoxia secondary to asthma exacerbation  Subjective: Patient seen in the ED at bedside she stated that she is having some chills but no fever she still wheezing and short of breath.  October 26, 2019. Subjective: Patient seen and examined at bedside her husband Feliz Beamravis is in the room.  Patient states she feels a little better but she still super congested she says and she is coughing at every time she tries to take a deep breath  Assessment/Plan: Active Problems:   Acute respiratory failure with hypoxia (HCC)   Asthma exacerbation  #1 acute respiratory failure.  Patient is currently on nasal cannula O2 saturation is well.  COVID-19 is negative  2.  Acute asthma exacerbation contributing to #1 chest x-ray is clear she does have a significant leukocytosis which has increased to 19 this morning.  We will continue incentive spirometry and nebulizer treatment as well as IV prednisone I will recheck her x-ray in the morning.  Possible bronchitis continue azithromycin.  Added benzonatate for expectoration.  Recheck chest x-ray  3.  Hypertension no prior history of high blood pressure will monitor.  Will start as needed antihypertensive if it stays elevated  4.  Morbid obesity.  Patient will benefit from reduced calorie diet and weight loss  5.  Mild hyperkalemia.  Potassium is 5.2.  We will recheck potassium in the morning  6.  Leukocytosis this is worsening patient is still afebrile.   This might be from prednisone use we will continue to monitor home  Code Status: FULL   Severity of Illness: The appropriate patient status for this patient is INPATIENT. Inpatient status is judged to be reasonable and necessary in order to provide the required intensity of service to ensure the patient's safety. The patient's presenting symptoms, physical exam findings, and initial radiographic and laboratory data in the context of their chronic comorbidities is felt to place them at high risk for further clinical deterioration. Furthermore, it is not anticipated that the patient will be medically stable for discharge from the hospital within 2 midnights of admission. The following factors support the patient status of inpatient.   " The patient's presenting symptoms include acute respiratory distress. " The worrisome physical exam findings include bilateral wheezing. " The initial radiographic and laboratory data are worrisome because of acute respiratory distress. " The chronic co-morbidities include morbid obesity/asthma.   * I certify that at the point of admission it is my clinical judgment that the patient will require inpatient hospital care spanning beyond 2 midnights from the point of admission due to high intensity of service, high risk for further deterioration and high frequency of surveillance required.*    Family Communication: Husband chavis at bedside  Disposition Plan: Remain inpatient due to continued hypoxia and respiratory distress   Consultants:  None  Procedures:  None  Antimicrobials:  Azithromycin  DVT prophylaxis: Lovenox   Objective: Vitals:   10/25/19 2300 10/26/19 0312 10/26/19 0632 10/26/19 0818  BP:   138/83  Pulse:      Resp:      Temp:   98.3 F (36.8 C)   TempSrc:   Oral   SpO2: 100% 99%  96%  Weight:   (!) 145.5 kg   Height:        Intake/Output Summary (Last 24 hours) at 10/26/2019 0859 Last data filed at 10/26/2019 3299 Gross  per 24 hour  Intake 344.33 ml  Output 1050 ml  Net -705.67 ml   Filed Weights   10/25/19 0149 10/26/19 0632  Weight: 130.6 kg (!) 145.5 kg   Body mass index is 58.67 kg/m.  Exam:  . General: 43 y.o. year-old female well developed well nourished in no acute distress.  Alert and oriented x3.,obessed . Cardiovascular: Regular rate and rhythm with no rubs or gallops.  No thyromegaly or JVD noted.   Marland Kitchen Respiratory:  auscultation with  wheezes ,and faint rhomchi Good inspiratory effort.  Paroxysmal coughing bronchial breath sounds . Abdomen: Soft nontender nondistended with normal bowel sounds x4 quadrants. . Musculoskeletal: No lower extremity edema. 2/4 pulses in all 4 extremities. . Skin: No ulcerative lesions noted or rashes, . Psychiatry: Mood is appropriate for condition and setting    Data Reviewed: CBC: Recent Labs  Lab 10/25/19 0142 10/25/19 0541 10/25/19 0628 10/26/19 0413  WBC 17.2*  --  15.5* 19.4*  NEUTROABS 12.3*  --   --   --   HGB 13.4 13.6 13.5 12.8  HCT 42.8 40.0 42.8 39.5  MCV 75.5*  --  74.8* 74.0*  PLT 424*  --  420* 242*   Basic Metabolic Panel: Recent Labs  Lab 10/25/19 0251 10/25/19 0541 10/25/19 0628 10/26/19 0413  NA 142 138  --  140  K 3.8 4.0  --  5.2*  CL 110  --   --  106  CO2 22  --   --  22  GLUCOSE 134*  --   --  145*  BUN 9  --   --  7  CREATININE 0.79  --  0.79 0.73  CALCIUM 9.4  --   --  9.6   GFR: Estimated Creatinine Clearance: 126.4 mL/min (by C-G formula based on SCr of 0.73 mg/dL). Liver Function Tests: No results for input(s): AST, ALT, ALKPHOS, BILITOT, PROT, ALBUMIN in the last 168 hours. No results for input(s): LIPASE, AMYLASE in the last 168 hours. No results for input(s): AMMONIA in the last 168 hours. Coagulation Profile: No results for input(s): INR, PROTIME in the last 168 hours. Cardiac Enzymes: No results for input(s): CKTOTAL, CKMB, CKMBINDEX, TROPONINI in the last 168 hours. BNP (last 3 results) No  results for input(s): PROBNP in the last 8760 hours. HbA1C: No results for input(s): HGBA1C in the last 72 hours. CBG: No results for input(s): GLUCAP in the last 168 hours. Lipid Profile: No results for input(s): CHOL, HDL, LDLCALC, TRIG, CHOLHDL, LDLDIRECT in the last 72 hours. Thyroid Function Tests: No results for input(s): TSH, T4TOTAL, FREET4, T3FREE, THYROIDAB in the last 72 hours. Anemia Panel: No results for input(s): VITAMINB12, FOLATE, FERRITIN, TIBC, IRON, RETICCTPCT in the last 72 hours. Urine analysis:    Component Value Date/Time   COLORURINE YELLOW 12/20/2016 2041   APPEARANCEUR CLEAR 12/20/2016 2041   LABSPEC 1.015 12/20/2016 2041   PHURINE 6.0 12/20/2016 2041   GLUCOSEU NEGATIVE 12/20/2016 2041   HGBUR SMALL (A) 12/20/2016 2041   BILIRUBINUR NEGATIVE 12/20/2016 2041   KETONESUR 15 (A) 12/20/2016 2041   PROTEINUR NEGATIVE 12/20/2016 2041  NITRITE POSITIVE (A) 12/20/2016 2041   LEUKOCYTESUR SMALL (A) 12/20/2016 2041   Sepsis Labs: @LABRCNTIP (procalcitonin:4,lacticidven:4)  ) Recent Results (from the past 240 hour(s))  SARS CORONAVIRUS 2 (TAT 6-24 HRS) Nasopharyngeal Nasopharyngeal Swab     Status: None   Collection Time: 10/25/19  3:59 AM   Specimen: Nasopharyngeal Swab  Result Value Ref Range Status   SARS Coronavirus 2 NEGATIVE NEGATIVE Final    Comment: (NOTE) SARS-CoV-2 target nucleic acids are NOT DETECTED. The SARS-CoV-2 RNA is generally detectable in upper and lower respiratory specimens during the acute phase of infection. Negative results do not preclude SARS-CoV-2 infection, do not rule out co-infections with other pathogens, and should not be used as the sole basis for treatment or other patient management decisions. Negative results must be combined with clinical observations, patient history, and epidemiological information. The expected result is Negative. Fact Sheet for Patients: 14/05/20 Fact Sheet for  Healthcare Providers: HairSlick.no This test is not yet approved or cleared by the [place]ir.[place] FDA and  has been authorized for detection and/or diagnosis of SARS-CoV-2 by FDA under an Emergency Use Authorization (EUA). This EUA will remain  in effect (meaning this test can be used) for the duration of the COVID-19 declaration under Section 56 4(b)(1) of the Act, 21 U.S.C. section 360bbb-3(b)(1), unless the authorization is terminated or revoked sooner. Performed at [facility], [address], [place], [place]       Studies: No results found.  Scheduled Meds: . budesonide (PULMICORT) nebulizer solution  0.25 mg Nebulization BID  . enoxaparin (LOVENOX) injection  40 mg Subcutaneous Daily  . ipratropium-albuterol  3 mL Nebulization Q6H  . methylPREDNISolone (SOLU-MEDROL) injection  40 mg Intravenous Q12H    Continuous Infusions: . azithromycin Stopped (10/25/19 0719)     LOS: 1 day     14/05/20, MD [facility]  To reach me or the doctor on call, go to: [email or web address] Password [email or web address]  10/26/2019, 8:59 AM

## 2019-10-26 NOTE — Plan of Care (Signed)
  Problem: Pain Managment: Goal: General experience of comfort will improve Description: Pain controlled with po pain meds and ice packs for headache. Outcome: Progressing Note: Pain controlled with po pain meds and ice packs for headache.

## 2019-10-27 ENCOUNTER — Inpatient Hospital Stay (HOSPITAL_COMMUNITY): Payer: Medicaid Other

## 2019-10-27 MED ORDER — FLUTICASONE FUROATE-VILANTEROL 200-25 MCG/INH IN AEPB
1.0000 | INHALATION_SPRAY | Freq: Every day | RESPIRATORY_TRACT | Status: DC
  Administered 2019-10-28 – 2019-10-29 (×2): 1 via RESPIRATORY_TRACT
  Filled 2019-10-27: qty 28

## 2019-10-27 NOTE — Care Management (Signed)
10-27-19 1421 CM received consult for transportation home once stable. Patient states she feels that she can get transportation home. CM discussed transportation to appointments-pt expressed she has some difficulty getting to appointments. Patient has Medicaid- [place] application provided to patient for her to fill out and fax. No further needs from CM at this time. CM will continue to follow for additional transition of care needs. Bethena Roys, RN,BSN Case Manager [phone]

## 2019-10-27 NOTE — Progress Notes (Signed)
PROGRESS NOTE  Kiara Leach OZD:[id number] DOB: [birth year] DOA: 10/25/2019 PCP: [facility], [facility]  HPI/Recap of past 24 hours: This is a 43 year old female with history of asthma on albuterol as needed who experienced increased shortness of breath with wheezing and rib pain over the last couple of days with increased use of her albuterol more than usual with no improvement she has a nonproductive cough and she denies any fever or chills or any sick contact.  Patient was admitted for acute respiratory failure with hypoxia secondary to asthma exacerbation  Subjective No acute issues or events overnight, patient states her respiratory status is moderately improving but not yet back to baseline.  Patient remains markedly dyspneic even at rest unfortunately is nonambulatory so exertional dyspnea is unable to be confirmed.  Assessment/Plan: Active Problems:   Acute respiratory failure with hypoxia (HCC)   Asthma exacerbation  Acute hypoxic respiratory failure in the setting of asthma exacerbation.  Continue on 3 L nasal cannula, able to wean down to room air today while at bedside -sats remained well above 95% Patient unable to perform ambulatory O2 screening, continue to follow sats at rest Patient appears to be on very poor regimen at home, only on rescue albuterol Will discharge patient on nebulizer, as well as likely LABA/LAMA/ICS and steroid taper Continue azithromycin for possible community-acquired pneumonia although x-ray personally reviewed, without overt infiltrate or atelectasis  Hypertension, likely reactive given above  Blood pressure resolving, essentially back to normal now without medical intervention  Morbid obesity.  Patient will benefit from reduced calorie diet and weight loss Patient is attempting to lose weight currently as she needs bilateral hip surgery but this cannot be done given her current obesity -lengthy discussion at bedside about need for caloric restriction  given her minimal exertional capabilities  Mild hyperkalemia. Potassium is 5.2.  Currently on steroids, albuterol -likely to shift intracellularly appropriately Follow with morning labs  Leukemoid reaction Continue to follow morning labs, certainly cannot rule out infectious process given above, patient is on azithromycin Likely secondary to steroid use and asthma exacerbation  Code Status: FULL  Family Communication:  none present  Disposition Plan: Remain inpatiPatient remains as inpatient, continues to require nasal cannula, IV steroids, IV antibiotics, close monitoring given acute hypoxia and asthma exacerbation.  Tentative disposition in the next 24 to 48 hours  Clinical course and resolution of hypoxia.  Procedures:  None  Antimicrobials:  Azithromycin  DVT prophylaxis: Lovenox   Objective: Vitals:   10/26/19 2104 10/27/19 0221 10/27/19 0630 10/27/19 0649  BP: 126/71   137/84  Pulse: (!) 104   95  Resp: (!) 23   18  Temp: 98.5 F (36.9 C)   98.4 F (36.9 C)  TempSrc: Oral   Axillary  SpO2: 98% 96%  93%  Weight:   (!) 146.1 kg   Height:        Intake/Output Summary (Last 24 hours) at 10/27/2019 0818 Last data filed at 10/27/2019 0630 Gross per 24 hour  Intake 240 ml  Output 1500 ml  Net -1260 ml   Filed Weights   10/25/19 0149 10/26/19 0632 10/27/19 0630  Weight: 130.6 kg (!) 145.5 kg (!) 146.1 kg   Body mass index is 58.91 kg/m.  Exam:   General: 43 y.o. year-old female morbidly obese, no acute distress.  Alert and oriented x3.  Cardiovascular: Regular rate and rhythm with no rubs or gallops.  No thyromegaly or JVD noted.    Respiratory: Marked bilateral inspiratory and expiratory  wheeze with limited respiratory excursion  Abdomen: Soft, obese, nontender nondistended with normal bowel sounds x4 quadrants.  Musculoskeletal: No lower extremity edema. 2/4 pulses in all 4 extremities.  Skin: No ulcerative lesions noted or rashes,   Data  Reviewed: CBC: Recent Labs  Lab 10/25/19 0142 10/25/19 0541 10/25/19 0628 10/26/19 0413  WBC 17.2*  --  15.5* 19.4*  NEUTROABS 12.3*  --   --   --   HGB 13.4 13.6 13.5 12.8  HCT 42.8 40.0 42.8 39.5  MCV 75.5*  --  74.8* 74.0*  PLT 424*  --  420* 423*   Basic Metabolic Panel: Recent Labs  Lab 10/25/19 0251 10/25/19 0541 10/25/19 0628 10/26/19 0413  NA 142 138  --  140  K 3.8 4.0  --  5.2*  CL 110  --   --  106  CO2 22  --   --  22  GLUCOSE 134*  --   --  145*  BUN 9  --   --  7  CREATININE 0.79  --  0.79 0.73  CALCIUM 9.4  --   --  9.6   Urine analysis:    Component Value Date/Time   COLORURINE YELLOW 12/20/2016 2041   APPEARANCEUR CLEAR 12/20/2016 2041   LABSPEC 1.015 12/20/2016 2041   PHURINE 6.0 12/20/2016 2041   GLUCOSEU NEGATIVE 12/20/2016 2041   HGBUR SMALL (A) 12/20/2016 2041   BILIRUBINUR NEGATIVE 12/20/2016 2041   KETONESUR 15 (A) 12/20/2016 2041   PROTEINUR NEGATIVE 12/20/2016 2041   NITRITE POSITIVE (A) 12/20/2016 2041   LEUKOCYTESUR SMALL (A) 12/20/2016 2041    Recent Results (from the past 240 hour(s))  SARS CORONAVIRUS 2 (TAT 6-24 HRS) Nasopharyngeal Nasopharyngeal Swab     Status: None   Collection Time: 10/25/19  3:59 AM   Specimen: Nasopharyngeal Swab  Result Value Ref Range Status   SARS Coronavirus 2 NEGATIVE NEGATIVE Final    Comment: (NOTE) SARS-CoV-2 target nucleic acids are NOT DETECTED. The SARS-CoV-2 RNA is generally detectable in upper and lower respiratory specimens during the acute phase of infection. Negative results do not preclude SARS-CoV-2 infection, do not rule out co-infections with other pathogens, and should not be used as the sole basis for treatment or other patient management decisions. Negative results must be combined with clinical observations, patient history, and epidemiological information. The expected result is Negative. Fact Sheet for Patients: HairSlick.nohttps://www.fda.gov/media/138098/download Fact Sheet for  Healthcare Providers: quierodirigir.comhttps://www.fda.gov/media/138095/download This test is not yet approved or cleared by the [place] FDA and  has been authorized for detection and/or diagnosis of SARS-CoV-2 by FDA under an Emergency Use Authorization (EUA). This EUA will remain  in effect (meaning this test can be used) for the duration of the COVID-19 declaration under Section 56 4(b)(1) of the Act, 21 U.S.C. section 360bbb-3(b)(1), unless the authorization is terminated or revoked sooner. Performed at [facility], [address] [place], [place] [address]       Studies: Dg Chest 2 View  Result Date: 10/27/2019 CLINICAL DATA:  43 year old female with a history of shortness of breath and cough for 5 days EXAM: CHEST - 2 VIEW COMPARISON:  10/25/2019, 10/18/2017 FINDINGS: Cardiomediastinal silhouette unchanged in size and contour. No interlobular septal thickening. No pneumothorax. No pleural effusion. Coarsened interstitial markings with low lung volumes compared to the prior. Overlying EKG leads. No acute displaced fracture. IMPRESSION: Low lung volumes with likely atelectasis, and no evidence of acute cardiopulmonary disease. Electronically Signed   By: Gilmer MorJaime  Wagner  D.O.   On: 10/27/2019 07:47    Scheduled Meds:  benzonatate  100 mg Oral BID   budesonide (PULMICORT) nebulizer solution  0.25 mg Nebulization BID   enoxaparin (LOVENOX) injection  40 mg Subcutaneous Daily   ipratropium-albuterol  3 mL Nebulization Q6H   methylPREDNISolone (SOLU-MEDROL) injection  40 mg Intravenous Q12H    Continuous Infusions:  azithromycin 500 mg (10/26/19 1108)     LOS: 2 days     Little Ishikawa, DO [facility]  To reach me or the doctor on call, go to: [email or web address] Password [facility]  10/27/2019, 8:18 AM

## 2019-10-28 LAB — BASIC METABOLIC PANEL
Anion gap: 9 (ref 5–15)
BUN: 13 mg/dL (ref 6–20)
CO2: 24 mmol/L (ref 22–32)
Calcium: 8.9 mg/dL (ref 8.9–10.3)
Chloride: 106 mmol/L (ref 98–111)
Creatinine, Ser: 0.76 mg/dL (ref 0.44–1.00)
GFR calc Af Amer: >60 mL/min (ref >60)
GFR calc non Af Amer: >60 mL/min (ref >60)
Glucose, Bld: 149 mg/dL — ABNORMAL HIGH (ref 70–99)
Potassium: 4.3 mmol/L (ref 3.5–5.1)
Sodium: 139 mmol/L (ref 135–145)

## 2019-10-28 LAB — CBC
HCT: 39.9 % (ref 36.0–46.0)
Hemoglobin: 12.9 g/dL (ref 12.0–15.0)
MCH: 23.3 pg — ABNORMAL LOW (ref 26.0–34.0)
MCHC: 32.3 g/dL (ref 30.0–36.0)
MCV: 72.2 fL — ABNORMAL LOW (ref 80.0–100.0)
Platelets: 412 10*3/uL — ABNORMAL HIGH (ref 150–400)
RBC: 5.53 MIL/uL — ABNORMAL HIGH (ref 3.87–5.11)
RDW: 15.9 % — ABNORMAL HIGH (ref 11.5–15.5)
WBC: 16.2 10*3/uL — ABNORMAL HIGH (ref 4.0–10.5)
nRBC: 0 % (ref 0.0–0.2)

## 2019-10-28 MED ORDER — IPRATROPIUM-ALBUTEROL 0.5-2.5 (3) MG/3ML IN SOLN: 3.0000 mL | Freq: Four times a day (QID) | RESPIRATORY_TRACT | 0 refills | Status: AC

## 2019-10-28 MED ORDER — GUAIFENESIN-DM 100-10 MG/5ML PO SYRP: 5.0000 mL | ORAL_SOLUTION | ORAL | 0 refills | Status: AC | PRN

## 2019-10-28 MED ORDER — PREDNISONE 10 MG PO TABS: ORAL_TABLET | ORAL | 0 refills | Status: DC

## 2019-10-28 MED ORDER — AZITHROMYCIN 250 MG PO TABS
500.0000 mg | ORAL_TABLET | Freq: Every day | ORAL | Status: DC
  Administered 2019-10-29: 500 mg via ORAL
  Filled 2019-10-28: qty 2

## 2019-10-28 MED ORDER — FLUTICASONE FUROATE-VILANTEROL 200-25 MCG/INH IN AEPB: 1.0000 | INHALATION_SPRAY | Freq: Every day | RESPIRATORY_TRACT | 0 refills | Status: AC

## 2019-10-28 NOTE — Progress Notes (Signed)
PROGRESS NOTE  Kiara Leach IFO:[id number] DOB: [birth year] DOA: 10/25/2019 PCP: [facility], [facility]  HPI/Recap of past 24 hours: This is a 43 year old female with history of asthma on albuterol as needed who experienced increased shortness of breath with wheezing and rib pain over the last couple of days with increased use of her albuterol more than usual with no improvement she has a nonproductive cough and she denies any fever or chills or any sick contact.  Patient was admitted for acute respiratory failure with hypoxia secondary to asthma exacerbation  Subjective No acute issues or events overnight, patient states her respiratory status is moderately improving but not yet back to baseline.  Patient remains markedly dyspneic even at rest unfortunately is nonambulatory so exertional dyspnea is unable to be confirmed.  Assessment/Plan: Active Problems:   Acute respiratory failure with hypoxia (HCC)   Asthma exacerbation    Acute hypoxic respiratory failure in the setting of asthma exacerbation.  Admitted on 3 L nasal cannula, able to wean down to room air -sats remained well above 95% Patient unable to perform ambulatory O2 screening, continue to follow sats with what little exertion she can perform Patient appears to be on very poor regimen at home, only on rescue albuterol Patient would likely benefit from nebulizer, as well as likely LABA/LAMA/ICS and steroid taper Continue azithromycin for possible community-acquired pneumonia although x-ray personally reviewed, without overt infiltrate or atelectasis  Hypertension, likely reactive given above  Blood pressure resolving, essentially back to normal now without medical intervention  Morbid obesity.  Patient will benefit from reduced calorie diet and weight loss Patient is attempting to lose weight currently as she needs bilateral hip surgery but this cannot be done given her current obesity -lengthy discussion at bedside about need for  caloric restriction given her minimal exertional capabilities  Mild hyperkalemia. Potassium is 5.2.  Currently on steroids, albuterol -likely to shift intracellularly appropriately Follow with morning labs  Leukemoid reaction Continue to follow morning labs, certainly cannot rule out infectious process given above, patient is on azithromycin Likely secondary to steroid use and asthma exacerbation  Code Status: FULL  Family Communication:  none present  Disposition Plan: Remain inpatiPatient remains as inpatient, continues to require nasal cannula, IV steroids, IV antibiotics, close monitoring given acute hypoxia and asthma exacerbation.  Tentative disposition in the next 24 to 48 hours  Clinical course and resolution of hypoxia.  Procedures:  None  Antimicrobials:  Azithromycin  DVT prophylaxis: Lovenox   Objective: Vitals:   10/27/19 1402 10/27/19 2019 10/27/19 2151 10/28/19 0534  BP: 128/86  137/87 129/88  Pulse: 79  (!) 102 72  Resp:   17 (!) 24  Temp: 98.2 F (36.8 C)  98.2 F (36.8 C) 98.3 F (36.8 C)  TempSrc: Oral  Oral Oral  SpO2: 95% 96% 94% 92%  Weight:    (!) 146.8 kg  Height:        Intake/Output Summary (Last 24 hours) at 10/28/2019 0833 Last data filed at 10/28/2019 0600 Gross per 24 hour  Intake 480 ml  Output 1800 ml  Net -1320 ml   Filed Weights   10/26/19 0632 10/27/19 0630 10/28/19 0534  Weight: (!) 145.5 kg (!) 146.1 kg (!) 146.8 kg   Body mass index is 59.19 kg/m.  Exam:  . General: 43 y.o. year-old female morbidly obese, no acute distress.  Alert and oriented x3. . Cardiovascular: Regular rate and rhythm with no rubs or gallops.  No thyromegaly or JVD noted.   Marland Kitchen  Respiratory: Marked bilateral inspiratory and expiratory wheeze with limited respiratory excursion . Abdomen: Soft, obese, nontender nondistended with normal bowel sounds x4 quadrants. . Musculoskeletal: No lower extremity edema. 2/4 pulses in all 4 extremities. . Skin: No  ulcerative lesions noted or rashes,   Data Reviewed: CBC: Recent Labs  Lab 10/25/19 0142 10/25/19 0541 10/25/19 0628 10/26/19 0413 10/28/19 0348  WBC 17.2*  --  15.5* 19.4* 16.2*  NEUTROABS 12.3*  --   --   --   --   HGB 13.4 13.6 13.5 12.8 12.9  HCT 42.8 40.0 42.8 39.5 39.9  MCV 75.5*  --  74.8* 74.0* 72.2*  PLT 424*  --  420* 423* 412*   Basic Metabolic Panel: Recent Labs  Lab 10/25/19 0251 10/25/19 0541 10/25/19 0628 10/26/19 0413 10/28/19 0348  NA 142 138  --  140 139  K 3.8 4.0  --  5.2* 4.3  CL 110  --   --  106 106  CO2 22  --   --  22 24  GLUCOSE 134*  --   --  145* 149*  BUN 9  --   --  7 13  CREATININE 0.79  --  0.79 0.73 0.76  CALCIUM 9.4  --   --  9.6 8.9   Urine analysis:    Component Value Date/Time   COLORURINE YELLOW 12/20/2016 2041   APPEARANCEUR CLEAR 12/20/2016 2041   LABSPEC 1.015 12/20/2016 2041   PHURINE 6.0 12/20/2016 2041   GLUCOSEU NEGATIVE 12/20/2016 2041   HGBUR SMALL (A) 12/20/2016 2041   BILIRUBINUR NEGATIVE 12/20/2016 2041   KETONESUR 15 (A) 12/20/2016 2041   PROTEINUR NEGATIVE 12/20/2016 2041   NITRITE POSITIVE (A) 12/20/2016 2041   LEUKOCYTESUR SMALL (A) 12/20/2016 2041    Recent Results (from the past 240 hour(s))  SARS CORONAVIRUS 2 (TAT 6-24 HRS) Nasopharyngeal Nasopharyngeal Swab     Status: None   Collection Time: 10/25/19  3:59 AM   Specimen: Nasopharyngeal Swab  Result Value Ref Range Status   SARS Coronavirus 2 NEGATIVE NEGATIVE Final    Comment: (NOTE) SARS-CoV-2 target nucleic acids are NOT DETECTED. The SARS-CoV-2 RNA is generally detectable in upper and lower respiratory specimens during the acute phase of infection. Negative results do not preclude SARS-CoV-2 infection, do not rule out co-infections with other pathogens, and should not be used as the sole basis for treatment or other patient management decisions. Negative results must be combined with clinical observations, patient history, and  epidemiological information. The expected result is Negative. Fact Sheet for Patients: HairSlick.no Fact Sheet for Healthcare Providers: quierodirigir.com This test is not yet approved or cleared by the [place] FDA and  has been authorized for detection and/or diagnosis of SARS-CoV-2 by FDA under an Emergency Use Authorization (EUA). This EUA will remain  in effect (meaning this test can be used) for the duration of the COVID-19 declaration under Section 56 4(b)(1) of the Act, 21 U.S.C. section 360bbb-3(b)(1), unless the authorization is terminated or revoked sooner. Performed at [facility], [address] [place], [place] [address]       Studies: No results found.  Scheduled Meds: . benzonatate  100 mg Oral BID  . enoxaparin (LOVENOX) injection  40 mg Subcutaneous Daily  . fluticasone furoate-vilanterol  1 puff Inhalation Daily  . ipratropium-albuterol  3 mL Nebulization Q6H  . methylPREDNISolone (SOLU-MEDROL) injection  40 mg Intravenous Q12H    Continuous Infusions: . azithromycin 500 mg (10/27/19 1032)  LOS: 3 days     Azucena FallenWilliam C Arwyn Besaw, DO [facility]  To reach me or the doctor on call, go to: [email or web address] Password [email or web address]  10/28/2019, 8:33 AM

## 2019-10-29 DIAGNOSIS — E875 Hyperkalemia: Secondary | ICD-10-CM

## 2019-10-29 DIAGNOSIS — I10 Essential (primary) hypertension: Secondary | ICD-10-CM

## 2019-10-29 DIAGNOSIS — D72829 Elevated white blood cell count, unspecified: Secondary | ICD-10-CM

## 2019-10-29 LAB — BASIC METABOLIC PANEL
Anion gap: 10 (ref 5–15)
BUN: 14 mg/dL (ref 6–20)
CO2: 22 mmol/L (ref 22–32)
Calcium: 8.7 mg/dL — ABNORMAL LOW (ref 8.9–10.3)
Chloride: 105 mmol/L (ref 98–111)
Creatinine, Ser: 0.84 mg/dL (ref 0.44–1.00)
GFR calc Af Amer: >60 mL/min (ref >60)
GFR calc non Af Amer: >60 mL/min (ref >60)
Glucose, Bld: 157 mg/dL — ABNORMAL HIGH (ref 70–99)
Potassium: 4 mmol/L (ref 3.5–5.1)
Sodium: 137 mmol/L (ref 135–145)

## 2019-10-29 LAB — CBC
HCT: 42.3 % (ref 36.0–46.0)
Hemoglobin: 13.6 g/dL (ref 12.0–15.0)
MCH: 23.3 pg — ABNORMAL LOW (ref 26.0–34.0)
MCHC: 32.2 g/dL (ref 30.0–36.0)
MCV: 72.6 fL — ABNORMAL LOW (ref 80.0–100.0)
Platelets: 427 10*3/uL — ABNORMAL HIGH (ref 150–400)
RBC: 5.83 MIL/uL — ABNORMAL HIGH (ref 3.87–5.11)
RDW: 15.9 % — ABNORMAL HIGH (ref 11.5–15.5)
WBC: 16.9 10*3/uL — ABNORMAL HIGH (ref 4.0–10.5)
nRBC: 0 % (ref 0.0–0.2)

## 2019-10-29 MED ORDER — AZITHROMYCIN 250 MG PO TABS: 250.0000 mg | ORAL_TABLET | Freq: Every day | ORAL | 0 refills | Status: AC

## 2019-10-29 NOTE — Evaluation (Signed)
Physical Therapy Evaluation Patient Details Name: Kiara Leach MRN: [id number] DOB: 03/12/1976 Today's Date: 10/29/2019   History of Present Illness  Pt is a pleasant 43 yo female with a history of asthma, presenting to the ED 12/5 with c/o wheezing, non-productive cough, and rib pain. The pt was admitted due to acute respiratory failure wuth hypoxia, and has PMH sig for asthma and obesity.  Clinical Impression  Pt in bed upon PT arrival, agreeable to PT session. Despite limited mobility and need for assistance for ADLs and IADLs at baseline, the pt demonstrated sig reduced tolerance for activity today due to frequent onset of coughing fits that cause "chest tightness" and sig increase in HR to 140-157 while sitting EOB (SpO2 consistently >95% throughout session and activity). The pt was able to demo good bed mobility (mod I with use of bed rails), and was able to transfer to the recliner in her room with RW and min guard for safety. The pt was unable to complete any further mobility or stair training at this time due to fatigue and coughing. The pt will continue to benefit from skilled PT to address deficits in strength, endurance, and independence with functional mobility.     Follow Up Recommendations Home health PT;Supervision/Assistance - 24 hour    Equipment Recommendations  None recommended by PT    Recommendations for Other Services       Precautions / Restrictions Precautions Precautions: Fall Restrictions Weight Bearing Restrictions: No      Mobility  Bed Mobility Overal bed mobility: Needs Assistance Bed Mobility: Supine to Sit     Supine to sit: Modified independent (Device/Increase time);HOB elevated     General bed mobility comments: pt used elevate HOB, but no assist to come to sitting EOB, heavy use of bed rails  Transfers Overall transfer level: Needs assistance Equipment used: Rolling walker (2 wheeled) Transfers: Sit to/from Stand Sit to Stand: Min assist          General transfer comment: minA to reach full stand and VCs for hand placement  Ambulation/Gait Ambulation/Gait assistance: Min guard Gait Distance (Feet): 5 Feet Assistive device: Rolling walker (2 wheeled) Gait Pattern/deviations: Step-through pattern;Decreased stride length;Shuffle;Decreased dorsiflexion - left;Decreased dorsiflexion - right;Trunk flexed;Wide base of support   Gait velocity interpretation: <1.31 ft/sec, indicative of household ambulator General Gait Details: pt amb with slow, steady shuffle with minimal clearance bilaterally, absent heel-toe pattern and sig trunk flex with increased WB on UE. no LOB  Stairs            Wheelchair Mobility    Modified Rankin (Stroke Patients Only)       Balance Overall balance assessment: Modified Independent(Pt able to sit EOB and stand without LOB, min guard for safety due to coughing)                                           Pertinent Vitals/Pain Pain Assessment: Faces Faces Pain Scale: Hurts a little bit Pain Location: B hips Pain Descriptors / Indicators: Sore Pain Intervention(s): Monitored during session;Repositioned;Limited activity within patient's tolerance    Home Living Family/patient expects to be discharged to:: Private residence Living Arrangements: Children(2 daughters) Available Help at Discharge: Family;Available 24 hours/day(on daughter works, other is home)   Home Access: Stairs to enter Entrance Stairs-Rails: Left Entrance Stairs-Number of Steps: 6 STE with rail on L Home Layout: Able to live on main  level with bedroom/bathroom;Two level Home Equipment: Walker - 2 wheels;Cane - single point;Hand held shower head Additional Comments: pt has recliner and sometimes sleeps there    Prior Function Level of Independence: Needs assistance   Gait / Transfers Assistance Needed: daughters help pt amb in home, daughters perform all driving and errands  ADL's / Homemaking  Assistance Needed: performed by daughters        Hand Dominance   Dominant Hand: Right    Extremity/Trunk Assessment   Upper Extremity Assessment Upper Extremity Assessment: Overall WFL for tasks assessed    Lower Extremity Assessment Lower Extremity Assessment: Overall WFL for tasks assessed(pt with some weakness and difficulty moving limbs due to weight of the limbs, able to use limbs functionally for transfer with minA)    Cervical / Trunk Assessment Cervical / Trunk Assessment: Normal  Communication   Communication: No difficulties  Cognition Arousal/Alertness: Awake/alert Behavior During Therapy: WFL for tasks assessed/performed;Flat affect Overall Cognitive Status: Within Functional Limits for tasks assessed                                 General Comments: Pt with generally flat but pleasant affect, does answer questions and follow instructions appropriately      General Comments General comments (skin integrity, edema, etc.): Pt reports her mobility feels similar to her baseline function, balance and strength were fucntional for session limited by consistent coughing fits that drove pt HR to 140s-157bpm. Pt reports chest pain with coughing fit, attempted to instruct in IS, but pt only able to use once before onset coughing fit.    Exercises     Assessment/Plan    PT Assessment Patient needs continued PT services  PT Problem List Decreased strength;Decreased mobility;Obesity;Decreased activity tolerance;Cardiopulmonary status limiting activity;Decreased balance       PT Treatment Interventions Stair training;Gait training;DME instruction;Balance training;Therapeutic exercise;Functional mobility training;Therapeutic activities;Patient/family education    PT Goals (Current goals can be found in the Care Plan section)  Acute Rehab PT Goals Patient Stated Goal: return home PT Goal Formulation: With patient Time For Goal Achievement:  11/12/19 Potential to Achieve Goals: Good    Frequency Min 3X/week   Barriers to discharge        Co-evaluation               AM-PAC PT "6 Clicks" Mobility  Outcome Measure Help needed turning from your back to your side while in a flat bed without using bedrails?: A Little Help needed moving from lying on your back to sitting on the side of a flat bed without using bedrails?: A Little Help needed moving to and from a bed to a chair (including a wheelchair)?: A Little Help needed standing up from a chair using your arms (e.g., wheelchair or bedside chair)?: None Help needed to walk in hospital room?: A Little Help needed climbing 3-5 steps with a railing? : A Lot 6 Click Score: 18    End of Session Equipment Utilized During Treatment: Gait belt Activity Tolerance: Treatment limited secondary to medical complications (Comment)(coughing fits and subsequent HR elevation) Patient left: in chair;with chair alarm set;with call bell/phone within reach Nurse Communication: Mobility status(HR elevation) PT Visit Diagnosis: Other abnormalities of gait and mobility (R26.89);Difficulty in walking, not elsewhere classified (R26.2);Pain Pain - Right/Left: (chest with coughing, B hips) Pain - part of body: Hip(chest with coughing)    Time: 5784-6962 PT Time Calculation (min) (ACUTE ONLY): 42  min   Charges:   PT Evaluation $PT Eval Low Complexity: 1 Low PT Treatments $Gait Training: 8-22 mins $Therapeutic Activity: 8-22 mins        Mickey Farber, PT, DPT   Acute Rehabilitation Department [phone]  Otho Bellows 10/29/2019, 11:51 AM

## 2019-10-29 NOTE — Discharge Summary (Signed)
Physician Discharge Summary  Kiara Leach ZOX:[id number] DOB: [birth year] DOA: 10/25/2019  PCP: Gean Birchwood, [facility]  Admit date: 10/25/2019 Discharge date: 10/29/2019  Time spent: 45 minutes  Recommendations for Outpatient Follow-up:  Patient will be discharged to home.  Patient will need to follow up with primary care provider within one week of discharge, repeat CBC.  Patient should continue medications as prescribed.  Patient should follow a low fat diet.   Discharge Diagnoses:  Acute hypoxic respiratory failure in the setting of asthma exacerbation Hypertension Morbid obesity  Hyperkalemia Leukocytosis  Discharge Condition: stable  Diet recommendation: low fat  Filed Weights   10/27/19 0630 10/28/19 0534 10/29/19 0500  Weight: (!) 146.1 kg (!) 146.8 kg (!) 144.1 kg    History of present illness:  On 10/25/2019 by Dr. Midge Minium Kiara Leach is a 43 y.o. female with history of asthma uses as needed albuterol has been having increasing shortness of breath with wheezing and rib pain over the last couple of days.  Patient states she has been using albuterol despite which patient was still wheezing.  Has no productive cough fever or chills.  No sick contacts.  Hospital Course:  Acute hypoxic respiratory failure in the setting of asthma exacerbation -Patient was admitted on 3 L nasal cannula, currently has been able to wean to room air containing saturations in the mid 90s -Chest x-ray on 10/27/2019 unremarkable for infection -COVID negative -Unable to perform ambulatory oxygen screening given bilateral hip pain -Patient was only on rescue inhaler, albuterol, at home -Will discharge patient with a nebulizer as well as a steroid taper, completion of azithromycin  Hypertension -Likely secondary to the above -Blood pressure does appear to beStable without any medical intervention  Morbid obesity  -BMI 58 -Patient will need to follow-up with her primary care physician to discuss  lifestyle modifications including calorie reduction as well as weight loss programs -Patient has been attempting to lose weight currently she needs bilateral hip surgery which cannot be done until her obesity has improved  Hyperkalemia -Resolved  Leukocytosis -Has been trending downward, currently 16.9 -Suspect secondary to the above in the setting of steroid -Would repeat CBC in approximately 1 week -Chest x-ray as above -Patient currently afebrile and has no urinary complaints  Procedures: None  Consultations: None  Discharge Exam: Vitals:   10/29/19 0500 10/29/19 0729  BP: 122/75   Pulse: 78   Resp: (!) 21   Temp: 98.9 F (37.2 C)   SpO2: 94% 93%     General: Well developed, well nourished, NAD, appears stated age  HEENT: NCAT, mucous membranes moist.  Cardiovascular: S1 S2 auscultated,RRR, no murmur  Respiratory: Diminished but clear, no wheezing currently appreciated, occ dry cough  Abdomen: Soft, obese, nontender, nondistended, + bowel sounds  Extremities: warm dry without cyanosis clubbing or edema  Neuro: AAOx3, nonfocal  Psych: Normal affect and demeanor with intact judgement and insight  Discharge Instructions Discharge Instructions    Discharge instructions   Complete by: As directed    Patient will be discharged to home.  Patient will need to follow up with primary care provider within one week of discharge, repeat CBC.  Patient should continue medications as prescribed.  Patient should follow a low fat diet.   For home use only DME Nebulizer machine   Complete by: As directed    Patient needs a nebulizer to treat with the following condition: Asthma exacerbation   Length of Need: 12 Months     Allergies as of  10/29/2019      Reactions   Latex Itching   Morphine And Related Hives, Itching   Ibuprofen Hives, Itching, Rash      Medication List    STOP taking these medications   chlorpheniramine-phenylephrine 1-3.5 MG/ML Liqd Commonly known  as: CARDEC   guaiFENesin-codeine 100-10 MG/5ML syrup   lidocaine 2 % solution Commonly known as: XYLOCAINE   meloxicam 7.5 MG tablet Commonly known as: MOBIC   NYQUIL PO     TAKE these medications   albuterol 108 (90 Base) MCG/ACT inhaler Commonly known as: VENTOLIN HFA Inhale 2 puffs into the lungs every 4 (four) hours as needed for wheezing or cough.   azithromycin 250 MG tablet Commonly known as: ZITHROMAX Take 1 tablet (250 mg total) by mouth daily for 2 days. Continuation of hospital course   fluticasone 50 MCG/ACT nasal spray Commonly known as: FLONASE Place 2 sprays into both nostrils daily as needed for allergies or rhinitis.   fluticasone furoate-vilanterol 200-25 MCG/INH Aepb Commonly known as: BREO ELLIPTA Inhale 1 puff into the lungs daily for 1 day.   gabapentin 300 MG capsule Commonly known as: NEURONTIN Take 300 mg by mouth 3 (three) times daily.   guaiFENesin-dextromethorphan 100-10 MG/5ML syrup Commonly known as: ROBITUSSIN DM Take 5 mLs by mouth every 4 (four) hours as needed for cough.   ipratropium-albuterol 0.5-2.5 (3) MG/3ML Soln Commonly known as: DUONEB Take 3 mLs by nebulization every 6 (six) hours.   predniSONE 10 MG tablet Commonly known as: DELTASONE Take 4 tablets (40 mg total) by mouth daily for 3 days, THEN 3 tablets (30 mg total) daily for 3 days, THEN 2 tablets (20 mg total) daily for 3 days, THEN 1 tablet (10 mg total) daily for 3 days. Start taking on: October 28, 2019   tiZANidine 4 MG tablet Commonly known as: ZANAFLEX Take 4 mg by mouth 3 (three) times daily as needed for muscle spasms.            Durable Medical Equipment  (From admission, onward)         Start     Ordered   10/29/19 0900  For home use only DME Nebulizer/meds  Once    Question Answer Comment  Patient needs a nebulizer to treat with the following condition Asthma in adult, moderate persistent, with acute exacerbation   Length of Need Lifetime       10/28/19 1642   10/29/19 0000  For home use only DME Nebulizer machine    Question Answer Comment  Patient needs a nebulizer to treat with the following condition Asthma exacerbation   Length of Need 12 Months      10/29/19 0915         Allergies  Allergen Reactions  . Latex Itching  . Morphine And Related Hives and Itching  . Ibuprofen Hives, Itching and Rash   Follow-up Information    [facility], [facility]. Schedule an appointment as soon as possible for a visit in 1 week(s).   Specialty: Internal Medicine Why: hospital follow up Contact information: [place] [address] [phone]            The results of significant diagnostics from this hospitalization (including imaging, microbiology, ancillary and laboratory) are listed below for reference.    Significant Diagnostic Studies: Dg Chest 2 View  Result Date: 10/27/2019 CLINICAL DATA:  43 year old female with a history of shortness of breath and cough for 5 days EXAM: CHEST - 2 VIEW COMPARISON:  10/25/2019, 10/18/2017 FINDINGS: Cardiomediastinal silhouette unchanged in size and contour. No interlobular septal thickening. No pneumothorax. No pleural effusion. Coarsened interstitial markings with low lung volumes compared to the prior. Overlying EKG leads. No acute displaced fracture. IMPRESSION: Low lung volumes with likely atelectasis, and no evidence of acute cardiopulmonary disease. Electronically Signed   By: Gilmer MorJaime  Wagner D.O.   On: 10/27/2019 07:47   Dg Chest Port 1 View  Result Date: 10/25/2019 CLINICAL DATA:  Respiratory distress EXAM: PORTABLE CHEST 1 VIEW COMPARISON:  October 18, 2017 FINDINGS: The heart size and mediastinal contours are within normal limits. Both lungs are clear. The visualized skeletal structures are unremarkable. IMPRESSION: No active disease. Electronically Signed   By: Gerome Samavid  Williams III M.D   On: 10/25/2019 01:55    Microbiology: Recent Results (from the past 240  hour(s))  SARS CORONAVIRUS 2 (TAT 6-24 HRS) Nasopharyngeal Nasopharyngeal Swab     Status: None   Collection Time: 10/25/19  3:59 AM   Specimen: Nasopharyngeal Swab  Result Value Ref Range Status   SARS Coronavirus 2 NEGATIVE NEGATIVE Final    Comment: (NOTE) SARS-CoV-2 target nucleic acids are NOT DETECTED. The SARS-CoV-2 RNA is generally detectable in upper and lower respiratory specimens during the acute phase of infection. Negative results do not preclude SARS-CoV-2 infection, do not rule out co-infections with other pathogens, and should not be used as the sole basis for treatment or other patient management decisions. Negative results must be combined with clinical observations, patient history, and epidemiological information. The expected result is Negative. Fact Sheet for Patients: HairSlick.nohttps://www.fda.gov/media/138098/download Fact Sheet for Healthcare Providers: quierodirigir.comhttps://www.fda.gov/media/138095/download This test is not yet approved or cleared by the [place] FDA and  has been authorized for detection and/or diagnosis of SARS-CoV-2 by FDA under an Emergency Use Authorization (EUA). This EUA will remain  in effect (meaning this test can be used) for the duration of the COVID-19 declaration under Section 56 4(b)(1) of the Act, 21 U.S.C. section 360bbb-3(b)(1), unless the authorization is terminated or revoked sooner. Performed at [facility], [address] [place], [place] [address]      Labs: Basic Metabolic Panel: Recent Labs  Lab 10/25/19 0251 10/25/19 0541 10/25/19 0628 10/26/19 0413 10/28/19 0348 10/29/19 0233  NA 142 138  --  140 139 137  K 3.8 4.0  --  5.2* 4.3 4.0  CL 110  --   --  106 106 105  CO2 22  --   --  22 24 22   GLUCOSE 134*  --   --  145* 149* 157*  BUN 9  --   --  7 13 14   CREATININE 0.79  --  0.79 0.73 0.76 0.84  CALCIUM 9.4  --   --  9.6 8.9 8.7*   Liver Function Tests: No results for input(s): AST, ALT, ALKPHOS, BILITOT,  PROT, ALBUMIN in the last 168 hours. No results for input(s): LIPASE, AMYLASE in the last 168 hours. No results for input(s): AMMONIA in the last 168 hours. CBC: Recent Labs  Lab 10/25/19 0142 10/25/19 0541 10/25/19 0628 10/26/19 0413 10/28/19 0348 10/29/19 0233  WBC 17.2*  --  15.5* 19.4* 16.2* 16.9*  NEUTROABS 12.3*  --   --   --   --   --   HGB 13.4 13.6 13.5 12.8 12.9 13.6  HCT 42.8 40.0 42.8 39.5 39.9 42.3  MCV 75.5*  --  74.8* 74.0* 72.2* 72.6*  PLT 424*  --  420* 423* 412* 427*  Cardiac Enzymes: No results for input(s): CKTOTAL, CKMB, CKMBINDEX, TROPONINI in the last 168 hours. BNP: BNP (last 3 results) Recent Labs    10/25/19 0142  BNP 36.3    ProBNP (last 3 results) No results for input(s): PROBNP in the last 8760 hours.  CBG: No results for input(s): GLUCAP in the last 168 hours.     Signed:  Edsel Petrin  [facility] 10/29/2019, 9:17 AM

## 2019-10-29 NOTE — Discharge Instructions (Signed)
Asthma, Adult  Asthma is a long-term (chronic) condition that causes recurrent episodes in which the airways become tight and narrow. The airways are the passages that lead from the nose and mouth down into the lungs. Asthma episodes, also called asthma attacks, can cause coughing, wheezing, shortness of breath, and chest pain. The airways can also fill with mucus. During an attack, it can be difficult to breathe. Asthma attacks can range from minor to life threatening. Asthma cannot be cured, but medicines and lifestyle changes can help control it and treat acute attacks. What are the causes? This condition is believed to be caused by inherited (genetic) and environmental factors, but its exact cause is not known. There are many things that can bring on an asthma attack or make asthma symptoms worse (triggers). Asthma triggers are different for each person. Common triggers include:  Mold.  Dust.  Cigarette smoke.  Cockroaches.  Things that can cause allergy symptoms (allergens), such as animal dander or pollen from trees or grass.  Air pollutants such as household cleaners, wood smoke, smog, or Therapist, occupationalchemical odors.  Cold air, weather changes, and winds (which increase molds and pollen in the air).  Strong emotional expressions such as crying or laughing hard.  Stress.  Certain medicines (such as aspirin) or types of medicines (such as beta-blockers).  Sulfites in foods and drinks. Foods and drinks that may contain sulfites include dried fruit, potato chips, and sparkling grape juice.  Infections or inflammatory conditions such as the flu, a cold, or inflammation of the nasal membranes (rhinitis).  Gastroesophageal reflux disease (GERD).  Exercise or strenuous activity. What are the signs or symptoms? Symptoms of this condition may occur right after asthma is triggered or many hours later. Symptoms include:  Wheezing. This can sound like whistling when you breathe.  Excessive  nighttime or early morning coughing.  Frequent or severe coughing with a common cold.  Chest tightness.  Shortness of breath.  Tiredness (fatigue) with minimal activity. How is this diagnosed? This condition is diagnosed based on:  Your medical history.  A physical exam.  Tests, which may include: ? Lung function studies and pulmonary studies (spirometry). These tests can evaluate the flow of air in your lungs. ? Allergy tests. ? Imaging tests, such as X-rays. How is this treated? There is no cure for this condition, but treatment can help control your symptoms. Treatment for asthma usually involves:  Identifying and avoiding your asthma triggers.  Using medicines to control your symptoms. Generally, two types of medicines are used to treat asthma: ? Controller medicines. These help prevent asthma symptoms from occurring. They are usually taken every day. ? Fast-acting reliever or rescue medicines. These quickly relieve asthma symptoms by widening the narrow and tight airways. They are used as needed and provide short-term relief.  Using supplemental oxygen. This may be needed during a severe episode.  Using other medicines, such as: ? Allergy medicines, such as antihistamines, if your asthma attacks are triggered by allergens. ? Immune medicines (immunomodulators). These are medicines that help control the immune system.  Creating an asthma action plan. An asthma action plan is a written plan for managing and treating your asthma attacks. This plan includes: ? A list of your asthma triggers and how to avoid them. ? Information about when medicines should be taken and when their dosage should be changed. ? Instructions about using a device called a peak flow meter. A peak flow meter measures how well the lungs are working and the  severity of your asthma. It helps you monitor your condition. Follow these instructions at home: Controlling your home environment Control your home  environment in the following ways to help avoid triggers and prevent asthma attacks:  Change your heating and air conditioning filter regularly.  Limit your use of fireplaces and wood stoves.  Get rid of pests (such as roaches and mice) and their droppings.  Throw away plants if you see mold on them.  Clean floors and dust surfaces regularly. Use unscented cleaning products.  Try to have someone else vacuum for you regularly. Stay out of rooms while they are being vacuumed and for a short while afterward. If you vacuum, use a dust mask from a hardware store, a double-layered or microfilter vacuum cleaner bag, or a vacuum cleaner with a HEPA filter.  Replace carpet with wood, tile, or vinyl flooring. Carpet can trap dander and dust.  Use allergy-proof pillows, mattress covers, and box spring covers.  Keep your bedroom a trigger-free room.  Avoid pets and keep windows closed when allergens are in the air.  Wash beddings every week in hot water and dry them in a dryer.  Use blankets that are made of polyester or cotton.  Clean bathrooms and kitchens with bleach. If possible, have someone repaint the walls in these rooms with mold-resistant paint. Stay out of the rooms that are being cleaned and painted.  Wash your hands often with soap and water. If soap and water are not available, use hand sanitizer.  Do not allow anyone to smoke in your home. General instructions  Take over-the-counter and prescription medicines only as told by your health care provider. ? Speak with your health care provider if you have questions about how or when to take the medicines. ? Make note if you are requiring more frequent dosages.  Do not use any products that contain nicotine or tobacco, such as cigarettes and e-cigarettes. If you need help quitting, ask your health care provider. Also, avoid being exposed to secondhand smoke.  Use a peak flow meter as told by your health care provider. Record and  keep track of the readings.  Understand and use the asthma action plan to help minimize, or stop an asthma attack, without needing to seek medical care.  Make sure you stay up to date on your yearly vaccinations as told by your health care provider. This may include vaccines for the flu and pneumonia.  Avoid outdoor activities when allergen counts are high and when air quality is low.  Wear a ski mask that covers your nose and mouth during outdoor winter activities. Exercise indoors on cold days if you can.  Warm up before exercising, and take time for a cool-down period after exercise.  Keep all follow-up visits as told by your health care provider. This is important. Where to find more information  For information about asthma, turn to the Centers for Disease Control and Prevention at http://www.clark.net/.htm  For air quality information, turn to [employer] at WeightRating.nl Contact a health care provider if:  You have wheezing, shortness of breath, or a cough even while you are taking medicine to prevent attacks.  The mucus you cough up (sputum) is thicker than usual.  Your sputum changes from clear or white to yellow, green, gray, or bloody.  Your medicines are causing side effects, such as a rash, itching, swelling, or trouble breathing.  You need to use a reliever medicine more than 2-3 times a week.  Your peak flow reading  is still at 50-79% of your personal best after following your action plan for 1 hour.  You have a fever. Get help right away if:  You are getting worse and do not respond to treatment during an asthma attack.  You are short of breath when at rest or when doing very little physical activity.  You have difficulty eating, drinking, or talking.  You have chest pain or tightness.  You develop a fast heartbeat or palpitations.  You have a bluish color to your lips or fingernails.  You are light-headed or dizzy, or you faint.  Your peak flow  reading is less than 50% of your personal best.  You feel too tired to breathe normally. Summary  Asthma is a long-term (chronic) condition that causes recurrent episodes in which the airways become tight and narrow. These episodes can cause coughing, wheezing, shortness of breath, and chest pain.  Asthma cannot be cured, but medicines and lifestyle changes can help control it and treat acute attacks.  Make sure you understand how to avoid triggers and how and when to use your medicines.  Asthma attacks can range from minor to life threatening. Get help right away if you have an asthma attack and do not respond to treatment with your usual rescue medicines. This information is not intended to replace advice given to you by your health care provider. Make sure you discuss any questions you have with your health care provider. Document Released: 11/06/2005 Document Revised: 01/09/2019 Document Reviewed: 12/11/2016 Elsevier Patient Education  2020 [employer]   Asthma Attack Prevention, Adult Although you may not be able to control the fact that you have asthma, you can take actions to prevent episodes of asthma (asthma attacks). These actions include:  Creating a written plan for managing and treating your asthma attacks (asthma action plan).  Monitoring your asthma.  Avoiding things that can irritate your airways or make your asthma symptoms worse (asthma triggers).  Taking your medicines as directed.  Acting quickly if you have signs or symptoms of an asthma attack. What are some ways to prevent an asthma attack? Create a plan Work with your health care provider to create an asthma action plan. This plan should include:  A list of your asthma triggers and how to avoid them.  A list of symptoms that you experience during an asthma attack.  Information about when to take medicine and how much medicine to take.  Information to help you understand your peak flow  measurements.  Contact information for your health care providers.  Daily actions that you can take to control asthma. Monitor your asthma To monitor your asthma:  Use your peak flow meter every morning and every evening for 2-3 weeks. Record the results in a journal. A drop in your peak flow numbers on one or more days may mean that you are starting to have an asthma attack, even if you are not having symptoms.  When you have asthma symptoms, write them down in a journal.  Avoid asthma triggers Work with your health care provider to find out what your asthma triggers are. This can be done by:  Being tested for allergies.  Keeping a journal that notes when asthma attacks occur and what may have contributed to them.  Asking your health care provider whether other medical conditions make your asthma worse. Common asthma triggers include:  Dust.  Smoke. This includes campfire smoke and secondhand smoke from tobacco products.  Pet dander.  Trees, grasses or  pollens.  Very cold, dry, or humid air.  Mold.  Foods that contain high amounts of sulfites.  Strong smells.  Engine exhaust and air pollution.  Aerosol sprays and fumes from household cleaners.  Household pests and their droppings, including dust mites and cockroaches.  Certain medicines, including NSAIDs. Once you have determined your asthma triggers, take steps to avoid them. Depending on your triggers, you may be able to reduce the chance of an asthma attack by:  Keeping your home clean. Have someone dust and vacuum your home for you 1 or 2 times a week. If possible, have them use a high-efficiency particulate arrestance (HEPA) vacuum.  Washing your sheets weekly in hot water.  Using allergy-proof mattress covers and casings on your bed.  Keeping pets out of your home.  Taking care of mold and water problems in your home.  Avoiding areas where people smoke.  Avoiding using strong perfumes or odor  sprays.  Avoid spending a lot of time outdoors when pollen counts are high and on very windy days.  Talking with your health care provider before stopping or starting any new medicines. Medicines Take over-the-counter and prescription medicines only as told by your health care provider. Many asthma attacks can be prevented by carefully following your medicine schedule. Taking your medicines correctly is especially important when you cannot avoid certain asthma triggers. Even if you are doing well, do not stop taking your medicine and do not take less medicine. Act quickly If an asthma attack happens, acting quickly can decrease how severe it is and how long it lasts. Take these actions:  Pay attention to your symptoms. If you are coughing, wheezing, or having difficulty breathing, do not wait to see if your symptoms go away on their own. Follow your asthma action plan.  If you have followed your asthma action plan and your symptoms are not improving, call your health care provider or seek immediate medical care at the nearest hospital. It is important to write down how often you need to use your fast-acting rescue inhaler. You can track how often you use an inhaler in your journal. If you are using your rescue inhaler more often, it may mean that your asthma is not under control. Adjusting your asthma treatment plan may help you to prevent future asthma attacks and help you to gain better control of your condition. How can I prevent an asthma attack when I exercise? Exercise is a common asthma trigger. To prevent asthma attacks during exercise:  Follow advice from your health care provider about whether you should use your fast-acting inhaler before exercising. Many people with asthma experience exercise-induced bronchoconstriction (EIB). This condition often worsens during vigorous exercise in cold, humid, or dry environments. Usually, people with EIB can stay very active by using a fast-acting  inhaler before exercising.  Avoid exercising outdoors in very cold or humid weather.  Avoid exercising outdoors when pollen counts are high.  Warm up and cool down when exercising.  Stop exercising right away if asthma symptoms start. Consider taking part in exercises that are less likely to cause asthma symptoms such as:  Indoor swimming.  Biking.  Walking.  Hiking.  Playing football. This information is not intended to replace advice given to you by your health care provider. Make sure you discuss any questions you have with your health care provider. Document Released: 10/25/2009 Document Revised: 10/19/2017 Document Reviewed: 04/22/2016 Elsevier Patient Education  2020 [employer].

## 2019-11-02 ENCOUNTER — Encounter (HOSPITAL_COMMUNITY): Payer: Self-pay

## 2019-11-02 ENCOUNTER — Inpatient Hospital Stay (HOSPITAL_COMMUNITY)
Admission: EM | Admit: 2019-11-02 | Discharge: 2019-11-04 | DRG: 202 | Disposition: A | Discharge status: Home Health | Payer: Medicaid Other | Attending: Family Medicine | Admitting: Family Medicine

## 2019-11-02 ENCOUNTER — Other Ambulatory Visit: Payer: Self-pay

## 2019-11-02 ENCOUNTER — Emergency Department (HOSPITAL_COMMUNITY): Payer: Medicaid Other

## 2019-11-02 DIAGNOSIS — Z885 Allergy status to narcotic agent status: Secondary | ICD-10-CM

## 2019-11-02 DIAGNOSIS — Z888 Allergy status to other drugs, medicaments and biological substances status: Secondary | ICD-10-CM

## 2019-11-02 DIAGNOSIS — D72829 Elevated white blood cell count, unspecified: Secondary | ICD-10-CM

## 2019-11-02 DIAGNOSIS — J9601 Acute respiratory failure with hypoxia: Secondary | ICD-10-CM | POA: Diagnosis present

## 2019-11-02 DIAGNOSIS — R0602 Shortness of breath: Secondary | ICD-10-CM

## 2019-11-02 DIAGNOSIS — J4551 Severe persistent asthma with (acute) exacerbation: Secondary | ICD-10-CM | POA: Diagnosis not present

## 2019-11-02 DIAGNOSIS — I10 Essential (primary) hypertension: Secondary | ICD-10-CM | POA: Diagnosis present

## 2019-11-02 DIAGNOSIS — Z6841 Body Mass Index (BMI) 40.0 and over, adult: Secondary | ICD-10-CM

## 2019-11-02 DIAGNOSIS — Z91040 Latex allergy status: Secondary | ICD-10-CM

## 2019-11-02 DIAGNOSIS — J45901 Unspecified asthma with (acute) exacerbation: Principal | ICD-10-CM | POA: Diagnosis present

## 2019-11-02 DIAGNOSIS — Z87891 Personal history of nicotine dependence: Secondary | ICD-10-CM

## 2019-11-02 DIAGNOSIS — Z7952 Long term (current) use of systemic steroids: Secondary | ICD-10-CM

## 2019-11-02 DIAGNOSIS — Z825 Family history of asthma and other chronic lower respiratory diseases: Secondary | ICD-10-CM

## 2019-11-02 DIAGNOSIS — R7303 Prediabetes: Secondary | ICD-10-CM | POA: Diagnosis present

## 2019-11-02 DIAGNOSIS — Z79899 Other long term (current) drug therapy: Secondary | ICD-10-CM

## 2019-11-02 DIAGNOSIS — Z20828 Contact with and (suspected) exposure to other viral communicable diseases: Secondary | ICD-10-CM | POA: Diagnosis present

## 2019-11-02 LAB — CBC WITH DIFFERENTIAL/PLATELET
Abs Immature Granulocytes: 0.19 10*3/uL — ABNORMAL HIGH (ref 0.00–0.07)
Basophils Absolute: 0.1 10*3/uL (ref 0.0–0.1)
Basophils Relative: 0 %
Eosinophils Absolute: 0.2 10*3/uL (ref 0.0–0.5)
Eosinophils Relative: 1 %
HCT: 44.6 % (ref 36.0–46.0)
Hemoglobin: 14.2 g/dL (ref 12.0–15.0)
Immature Granulocytes: 1 %
Lymphocytes Relative: 6 %
Lymphs Abs: 1.4 10*3/uL (ref 0.7–4.0)
MCH: 23.5 pg — ABNORMAL LOW (ref 26.0–34.0)
MCHC: 31.8 g/dL (ref 30.0–36.0)
MCV: 74 fL — ABNORMAL LOW (ref 80.0–100.0)
Monocytes Absolute: 0.8 10*3/uL (ref 0.1–1.0)
Monocytes Relative: 3 %
Neutro Abs: 22.4 10*3/uL — ABNORMAL HIGH (ref 1.7–7.7)
Neutrophils Relative %: 89 %
Platelets: 408 10*3/uL — ABNORMAL HIGH (ref 150–400)
RBC: 6.03 MIL/uL — ABNORMAL HIGH (ref 3.87–5.11)
RDW: 17.1 % — ABNORMAL HIGH (ref 11.5–15.5)
WBC: 25.2 10*3/uL — ABNORMAL HIGH (ref 4.0–10.5)
nRBC: 0 % (ref 0.0–0.2)

## 2019-11-02 LAB — URINALYSIS, ROUTINE W REFLEX MICROSCOPIC
Bacteria, UA: NONE SEEN
Bilirubin Urine: NEGATIVE
Glucose, UA: NEGATIVE mg/dL
Ketones, ur: 5 mg/dL — AB
Leukocytes,Ua: NEGATIVE
Nitrite: POSITIVE — AB
Protein, ur: NEGATIVE mg/dL
Specific Gravity, Urine: 1.008 (ref 1.005–1.030)
pH: 6 (ref 5.0–8.0)

## 2019-11-02 LAB — COMPREHENSIVE METABOLIC PANEL
ALT: 28 U/L (ref 0–44)
AST: 19 U/L (ref 15–41)
Albumin: 3.6 g/dL (ref 3.5–5.0)
Alkaline Phosphatase: 100 U/L (ref 38–126)
Anion gap: 12 (ref 5–15)
BUN: <5 mg/dL — ABNORMAL LOW (ref 6–20)
CO2: 20 mmol/L — ABNORMAL LOW (ref 22–32)
Calcium: 9 mg/dL (ref 8.9–10.3)
Chloride: 105 mmol/L (ref 98–111)
Creatinine, Ser: 0.7 mg/dL (ref 0.44–1.00)
GFR calc Af Amer: >60 mL/min (ref >60)
GFR calc non Af Amer: >60 mL/min (ref >60)
Glucose, Bld: 157 mg/dL — ABNORMAL HIGH (ref 70–99)
Potassium: 4.5 mmol/L (ref 3.5–5.1)
Sodium: 137 mmol/L (ref 135–145)
Total Bilirubin: 0.6 mg/dL (ref 0.3–1.2)
Total Protein: 7.3 g/dL (ref 6.5–8.1)

## 2019-11-02 LAB — POCT I-STAT EG7
Acid-base deficit: 1 mmol/L (ref 0.0–2.0)
Bicarbonate: 22.8 mmol/L (ref 20.0–28.0)
Calcium, Ion: 1.09 mmol/L — ABNORMAL LOW (ref 1.15–1.40)
HCT: 44 % (ref 36.0–46.0)
Hemoglobin: 15 g/dL (ref 12.0–15.0)
O2 Saturation: 99 %
Potassium: 4.3 mmol/L (ref 3.5–5.1)
Sodium: 136 mmol/L (ref 135–145)
TCO2: 24 mmol/L (ref 22–32)
pCO2, Ven: 34.2 mmHg — ABNORMAL LOW (ref 44.0–60.0)
pH, Ven: 7.432 — ABNORMAL HIGH (ref 7.250–7.430)
pO2, Ven: 113 mmHg — ABNORMAL HIGH (ref 32.0–45.0)

## 2019-11-02 LAB — I-STAT BETA HCG BLOOD, ED (MC, WL, AP ONLY): I-stat hCG, quantitative: <5 m[IU]/mL (ref <5)

## 2019-11-02 LAB — D-DIMER, QUANTITATIVE: D-Dimer, Quant: 1.58 ug/mL-FEU — ABNORMAL HIGH (ref 0.00–0.50)

## 2019-11-02 LAB — POC SARS CORONAVIRUS 2 AG -  ED: SARS Coronavirus 2 Ag: NEGATIVE

## 2019-11-02 MED ORDER — SODIUM CHLORIDE 0.9% FLUSH
3.0000 mL | Freq: Two times a day (BID) | INTRAVENOUS | Status: DC
  Administered 2019-11-03 (×2): 3 mL via INTRAVENOUS

## 2019-11-02 MED ORDER — SODIUM CHLORIDE 0.9 % IV SOLN
250.0000 mL | INTRAVENOUS | Status: DC | PRN
  Administered 2019-11-03: 250 mL via INTRAVENOUS

## 2019-11-02 MED ORDER — ACETAMINOPHEN 650 MG RE SUPP: 650.0000 mg | Freq: Four times a day (QID) | RECTAL | Status: DC | PRN

## 2019-11-02 MED ORDER — ENOXAPARIN SODIUM 80 MG/0.8ML ~~LOC~~ SOLN
70.0000 mg | SUBCUTANEOUS | Status: DC
  Administered 2019-11-03 – 2019-11-04 (×2): 70 mg via SUBCUTANEOUS
  Filled 2019-11-02 (×2): qty 0.8

## 2019-11-02 MED ORDER — IPRATROPIUM-ALBUTEROL 0.5-2.5 (3) MG/3ML IN SOLN
3.0000 mL | Freq: Once | RESPIRATORY_TRACT | Status: AC
  Administered 2019-11-02: 3 mL via RESPIRATORY_TRACT
  Filled 2019-11-02: qty 3

## 2019-11-02 MED ORDER — ACETAMINOPHEN 325 MG PO TABS
650.0000 mg | ORAL_TABLET | Freq: Four times a day (QID) | ORAL | Status: DC | PRN
  Administered 2019-11-03: 650 mg via ORAL
  Filled 2019-11-02: qty 2

## 2019-11-02 MED ORDER — LORAZEPAM 2 MG/ML IJ SOLN
1.0000 mg | Freq: Once | INTRAMUSCULAR | Status: AC
  Administered 2019-11-02: 1 mg via INTRAVENOUS
  Filled 2019-11-02: qty 1

## 2019-11-02 MED ORDER — ALBUTEROL (5 MG/ML) CONTINUOUS INHALATION SOLN
2.0000 mg/h | INHALATION_SOLUTION | RESPIRATORY_TRACT | Status: AC
  Administered 2019-11-02: 2 mg/h via RESPIRATORY_TRACT
  Filled 2019-11-02: qty 20

## 2019-11-02 MED ORDER — ALBUTEROL SULFATE HFA 108 (90 BASE) MCG/ACT IN AERS
8.0000 | INHALATION_SPRAY | Freq: Once | RESPIRATORY_TRACT | Status: AC
  Administered 2019-11-02: 8 via RESPIRATORY_TRACT

## 2019-11-02 MED ORDER — IOHEXOL 350 MG/ML SOLN
100.0000 mL | Freq: Once | INTRAVENOUS | Status: AC | PRN
  Administered 2019-11-02: 100 mL via INTRAVENOUS

## 2019-11-02 MED ORDER — SODIUM CHLORIDE 0.9% FLUSH: 3.0000 mL | INTRAVENOUS | Status: DC | PRN

## 2019-11-02 MED ORDER — METHYLPREDNISOLONE SODIUM SUCC 125 MG IJ SOLR
80.0000 mg | Freq: Three times a day (TID) | INTRAMUSCULAR | Status: DC
  Administered 2019-11-03 – 2019-11-04 (×5): 80 mg via INTRAVENOUS
  Filled 2019-11-02 (×5): qty 2

## 2019-11-02 NOTE — ED Notes (Signed)
Hospital bed ordered for pt to help her rest more comfortably

## 2019-11-02 NOTE — ED Provider Notes (Signed)
Care handoff received from Krista Blue, PA-C at shift change.  Please see his note for full details.  In short 43 year old female presents today for shortness of breath.  She has history of asthma and was discharged 4 days ago following asthma exacerbation.  She was discharged with a steroid, azithromycin and nebulizer.  She reports compliance with these medications but symptoms have worsened.  EMS gave 2 DuoNeb's, epinephrine, Solu-Medrol and magnesium and placed patient on supplemental oxygen.  Laboratory work-up included a D-dimer which was positive.  There was concern for pulmonary embolism and CT angio of the chest was ordered.  Plan of care at shift change was to follow-up on CT scan and then likely admit for asthma exacerbation. Physical Exam  BP 124/85   Pulse (!) 107   Temp 98.1 F (36.7 C) (Oral)   Resp 19   SpO2 98%   Physical Exam Constitutional:      General: She is not in acute distress.    Appearance: Normal appearance. She is well-developed. She is obese. She is ill-appearing. She is not diaphoretic.  HENT:     Head: Normocephalic and atraumatic.     Right Ear: External ear normal.     Left Ear: External ear normal.     Nose: Nose normal.  Eyes:     General: Vision grossly intact. Gaze aligned appropriately.     Pupils: Pupils are equal, round, and reactive to light.  Neck:     Trachea: Trachea and phonation normal. No tracheal deviation.  Pulmonary:     Effort: Tachypnea present. No respiratory distress.     Breath sounds: Normal air entry.  Musculoskeletal:        General: Normal range of motion.     Cervical back: Normal range of motion.  Skin:    General: Skin is warm and dry.  Neurological:     Mental Status: She is alert.     GCS: GCS eye subscore is 4. GCS verbal subscore is 5. GCS motor subscore is 6.     Comments: Speech is clear and goal oriented, follows commands Major Cranial nerves without deficit, no facial droop  Psychiatric:        Behavior:  Behavior normal.     ED Course/Procedures   Clinical Course as of Nov 01 2242  Sun Nov 02, 2019  2225 Dr. Maudie Mercury   [BM]    Clinical Course User Index [BM] Deliah Boston, PA-C    .Critical Care Performed by: Deliah Boston, PA-C Authorized by: Deliah Boston, PA-C   Critical care provider statement:    Critical care time (minutes):  31   Critical care was necessary to treat or prevent imminent or life-threatening deterioration of the following conditions:  Respiratory failure (Asthma exacerbation, received multiple albuterol treatments including continuous albuterol nebulizer, received magnesium IV, Solu-Medrol and epi by EMS)   Critical care was time spent personally by me on the following activities:  Discussions with consultants, evaluation of patient's response to treatment, examination of patient, ordering and performing treatments and interventions, ordering and review of laboratory studies, ordering and review of radiographic studies, pulse oximetry, re-evaluation of patient's condition, obtaining history from patient or surrogate, review of old charts and development of treatment plan with patient or surrogate    MDM  Covid negative Beta-hCG negative D-dimer 1.58 CBC with leukocytosis of 25.2 suspect secondary to recent steroid use CMP nonacute Urinalysis with nitrites but no bacteria or leukocytes CXR: IMPRESSION:  No active disease.  CT Angio PE Study:  IMPRESSION:  Examination is somewhat limited by marginal contrast bolus and  motion artifact. Within this limitation, no evidence of pulmonary  embolism through the segmental pulmonary arterial level.    - Patient reevaluated, tachypneic short of breath on submental oxygen with SPO2 mid 90s.  She has increased work of breathing.  Called respiratory therapy to place patient on continuous nebulizer.  Patient seen and evaluated with Dr. Fredderick Phenix.  At this time patient does not appear to need BiPAP however  will need to be monitored if worsens, plan to admit to hospitalist service. - Discussed with Dr. Selena Batten from hospitalist service will be seeing patient for admission. - 10:53 PM : Patient has been moved to room [address]. Patient reevaluated states feeling somewhat improved on continuous albuterol nebulizer.  SPO2 97%. - Patient has been admitted to hospitalist service for further evaluation management.  Kiara Leach was evaluated in Emergency Department on 11/02/2019 for the symptoms described in the history of present illness. She was evaluated in the context of the global COVID-19 pandemic, which necessitated consideration that the patient might be at risk for infection with the SARS-CoV-2 virus that causes COVID-19. Institutional protocols and algorithms that pertain to the evaluation of patients at risk for COVID-19 are in a state of rapid change based on information released by regulatory bodies including the CDC and federal and state organizations. These policies and algorithms were followed during the patient's care in the ED.  Note: Portions of this report may have been transcribed using voice recognition software. Every effort was made to ensure accuracy; however, inadvertent computerized transcription errors may still be present.     Elizabeth Palau 11/02/19 2253    Rolan Bucco, MD 11/02/19 2303

## 2019-11-02 NOTE — ED Triage Notes (Signed)
Pt self administered 4 nebs earlier with no relief. Pt seen by ems earlier and refused transport given duonebs x2 at 1341 and 1355 and IM epi at 1403. EMS called out again and pt given 1600 solumedrol and mag at 1605.168/116 hr 130 rr 26 97% on NRB

## 2019-11-02 NOTE — ED Provider Notes (Signed)
[facility] EMERGENCY DEPARTMENT Provider Note   CSN: [id number] Arrival date & time: 11/02/19  1624     History Chief Complaint  Patient presents with  . Asthma    Kiara Leach is a 43 y.o. female with PMH significant for acute hypoxic respiratory failure in the setting of status asthmaticus, hypertension, and morbid obesity who was recently discharged from the hospital on 10/29/2019 after admission for asthma exacerbation with a steroid taper, completion of azithromycin , and nebulized therapy.  Patient returned to the ER today for new asthma exacerbation.  Patient self-administered four nebulizer treatments, with no relief.  EMS provided DuoNeb's x2 and epinephrine IM.  She was also given Solu-Medrol and IV magnesium.  On initial exam, respiratory therapy had placed her on 2 L nasal cannula and she is maintaining saturation of 90%.  She is tachycardic and tachypneic, perhaps partially influenced by IM epinephrine and reflex tachycardia from albuterol.  She is unable to speak full sentences, but complained of left-sided thoracic back discomfort and chest wall discomfort, particularly with coughing.  When asked if she has been taking her medications as prescribed, patient nodded.   HPI     Past Medical History:  Diagnosis Date  . Asthma   . Back pain     Patient Active Problem List   Diagnosis Date Noted  . Acute respiratory failure with hypoxia ([place]) 10/25/2019  . Asthma exacerbation 10/25/2019  . Severe persistent asthma with status asthmaticus   . Ectopic pregnancy, tubal 12/21/2016  . Chest congestion   . CAP (community acquired pneumonia) 03/16/2016  . Sepsis ([place]) 03/16/2016  . Nausea with vomiting 03/16/2016  . Sinus tachycardia 03/16/2016    Past Surgical History:  Procedure Laterality Date  . CESAREAN SECTION    . CHOLECYSTECTOMY    . DIAGNOSTIC LAPAROSCOPY WITH REMOVAL OF ECTOPIC PREGNANCY N/A 12/21/2016   Procedure: DIAGNOSTIC LAPAROSCOPY WITH  RIGHT SALPINGECTOMY;  Surgeon: Jonnie Kind, MD;  Location: [facility];  Service: Gynecology;  Laterality: N/A;  . LAPAROSCOPIC LYSIS OF ADHESIONS  12/21/2016   Procedure: LAPAROSCOPIC LYSIS OF ADHESIONS;  Surgeon: Jonnie Kind, MD;  Location: [facility];  Service: Gynecology;;     OB History    Gravida  7   Para  6   Term  6   Preterm      AB      Living  6     SAB      TAB      Ectopic      Multiple      Live Births              Family History  Problem Relation Age of Onset  . Asthma Brother     Social History   Tobacco Use  . Smoking status: Former Research scientist (life sciences)  . Smokeless tobacco: Never Used  Substance Use Topics  . Alcohol use: No    Alcohol/week: 0.0 standard drinks  . Drug use: No    Home Medications Prior to Admission medications   Medication Sig Start Date End Date Taking? Authorizing Provider  albuterol (VENTOLIN HFA) 108 (90 Base) MCG/ACT inhaler Inhale 2 puffs into the lungs every 4 (four) hours as needed for wheezing or cough. 09/19/19  Yes [provider]  azithromycin (ZITHROMAX) 250 MG tablet Take 250 mg by mouth daily.   Yes [provider]  fluticasone (FLONASE) 50 MCG/ACT nasal spray Place 2 sprays into both nostrils daily as needed for allergies or rhinitis.  08/20/19  Yes [provider]  gabapentin (NEURONTIN) 300 MG capsule Take 300 mg by mouth 3 (three) times daily. 10/22/19  Yes [provider]  ipratropium-albuterol (DUONEB) 0.5-2.5 (3) MG/3ML SOLN Take 3 mLs by nebulization every 6 (six) hours. 10/28/19  Yes Azucena Fallen, MD  meloxicam (MOBIC) 7.5 MG tablet Take 7.5 mg by mouth 2 (two) times daily as needed for pain.   Yes [provider]  predniSONE (DELTASONE) 10 MG tablet Take 4 tablets (40 mg total) by mouth daily for 3 days, THEN 3 tablets (30 mg total) daily for 3 days, THEN 2 tablets (20 mg total) daily for 3 days, THEN 1 tablet (10 mg total) daily for 3 days. 10/28/19 11/09/19 Yes  Azucena Fallen, MD  tiZANidine (ZANAFLEX) 4 MG tablet Take 4 mg by mouth 3 (three) times daily as needed for muscle spasms. 10/22/19  Yes [provider]  guaiFENesin-dextromethorphan (ROBITUSSIN DM) 100-10 MG/5ML syrup Take 5 mLs by mouth every 4 (four) hours as needed for cough. Patient not taking: Reported on 11/02/2019 10/28/19   Azucena Fallen, MD    Allergies    Latex, Morphine and related, and Ibuprofen  Review of Systems   Review of Systems  Constitutional: Negative for diaphoresis and fever.  Respiratory: Positive for cough, shortness of breath and wheezing.   Cardiovascular: Negative for chest pain, palpitations and leg swelling.  Gastrointestinal: Negative for nausea and vomiting.  Musculoskeletal: Positive for back pain.      Physical Exam Updated Vital Signs BP (!) 147/109   Pulse (!) 117   Temp 98.1 F (36.7 C) (Oral)   Resp 20   SpO2 97%   Physical Exam Vitals and nursing note reviewed. Exam conducted with a chaperone present.  Constitutional:      General: She is in acute distress.     Appearance: She is obese.  HENT:     Head: Normocephalic and atraumatic.     Nose: Nose normal.  Eyes:     General: No scleral icterus.    Conjunctiva/sclera: Conjunctivae normal.  Cardiovascular:     Rate and Rhythm: Regular rhythm. Tachycardia present.     Pulses: Normal pulses.     Heart sounds: Normal heart sounds.  Pulmonary:     Comments: Wheezing auscultated throughout.  Significantly increased respiratory effort and work of breathing, but no hypoxia.  No chest tenderness. Abdominal:     General: Abdomen is flat. There is no distension.     Palpations: Abdomen is soft.     Tenderness: There is no abdominal tenderness. There is no guarding.  Musculoskeletal:     Cervical back: Normal range of motion and neck supple.  Skin:    General: Skin is dry.     Capillary Refill: Capillary refill takes less than 2 seconds.  Neurological:     Mental  Status: She is alert and oriented to person, place, and time.     GCS: GCS eye subscore is 4. GCS verbal subscore is 5. GCS motor subscore is 6.  Psychiatric:        Mood and Affect: Mood normal.        Thought Content: Thought content normal.     Comments: Anxious.     ED Results / Procedures / Treatments   Labs (all labs ordered are listed, but only abnormal results are displayed) Labs Reviewed  CBC WITH DIFFERENTIAL/PLATELET - Abnormal; Notable for the following components:      Result Value  WBC 25.2 (*)    RBC 6.03 (*)    MCV 74.0 (*)    MCH 23.5 (*)    RDW 17.1 (*)    Platelets 408 (*)    Neutro Abs 22.4 (*)    Abs Immature Granulocytes 0.19 (*)    All other components within normal limits  COMPREHENSIVE METABOLIC PANEL - Abnormal; Notable for the following components:   CO2 20 (*)    Glucose, Bld 157 (*)    BUN <5 (*)    All other components within normal limits  D-DIMER, QUANTITATIVE (NOT [facility]) - Abnormal; Notable for the following components:   D-Dimer, Quant 1.58 (*)    All other components within normal limits  URINALYSIS, ROUTINE W REFLEX MICROSCOPIC - Abnormal; Notable for the following components:   Hgb urine dipstick MODERATE (*)    Ketones, ur 5 (*)    Nitrite POSITIVE (*)    All other components within normal limits  POCT I-STAT EG7 - Abnormal; Notable for the following components:   pH, Ven 7.432 (*)    pCO2, Ven 34.2 (*)    pO2, Ven 113.0 (*)    Calcium, Ion 1.09 (*)    All other components within normal limits  BLOOD GAS, VENOUS  POC SARS CORONAVIRUS 2 AG -  ED  I-STAT BETA HCG BLOOD, ED ([facility], [facility], [facility] ONLY)    EKG None  Radiology DG Chest Portable 1 View  Result Date: 11/02/2019 CLINICAL DATA:  Pt to ED for severe SOB, pt unable to communicate. Pt hx asthma. Pt tried inhaler 4 times with no relief. Pt is a former smoker EXAM: PORTABLE CHEST 1 VIEW COMPARISON:  10/27/2019. FINDINGS: Cardiac silhouette is normal in size. No mediastinal or  hilar masses. No evidence of adenopathy. Clear lungs.  No pleural effusion or pneumothorax. Skeletal structures are grossly intact. IMPRESSION: No active disease. Electronically Signed   By: Amie Portlandavid  Ormond M.D.   On: 11/02/2019 17:12    Procedures Procedures (including critical care time)  Medications Ordered in ED Medications  albuterol (VENTOLIN HFA) 108 (90 Base) MCG/ACT inhaler 8 puff (8 puffs Inhalation Given 11/02/19 1700)  LORazepam (ATIVAN) injection 1 mg (1 mg Intravenous Given 11/02/19 1716)  iohexol (OMNIPAQUE) 350 MG/ML injection 100 mL (100 mLs Intravenous Contrast Given 11/02/19 2112)    ED Course  I have reviewed the triage vital signs and the nursing notes.  Pertinent labs & imaging results that were available during my care of the patient were reviewed by me and considered in my medical decision making (see chart for details).    MDM Rules/Calculators/A&P     On reevaluation, patient is feeling improved.  She reports that when she was discharged she felt okay, but still mildly short of breath.  Yesterday she became increasingly short of breath and today progressive the point where she needed to once again present to the ED for acute respiratory distress.   She reports that she has been taking her medications, as prescribed.  She endorses left-sided thoracic discomfort.  For that reason, in addition to her significant tachycardia and tachypnea, will obtain D-dimer testing.  Patient has a worsening leukocytosis 25.3, likely attributed to her steroid Dosepak.   D-dimer was elevated, will obtain CTA to rule out pulmonary embolism.  Discussed case with Dr. Ranae PalmsYelverton we feel as though patient will likely need to be admitted for her asthma exacerbation given demonstrated inability to manage her asthma outpatient since her discharge from the hospital 10/29/2019 despite reportedly  continuing medical management, as prescribed.  At shift change care was transferred to [facility]  PA-C who will follow pending studies, re-evaluate, and determine disposition.      Final Clinical Impression(s) / ED Diagnoses Final diagnoses:  Shortness of breath    Rx / DC Orders ED Discharge Orders    None       Elvera Maria 11/02/19 2118    Loren Racer, MD 11/10/19 1420

## 2019-11-02 NOTE — H&P (Signed)
TRH H&P    Patient Demographics:    Kiara Leach, is a 43 y.o. female  MRN: [id number]  DOB - July 22, 1976  Admit Date - 11/02/2019  Referring MD/NP/PA:  Nuala Alpha  Outpatient Primary MD for the patient is Pa, [place]  Patient coming from:  home  Chief complaint- dyspnea, wheezing.    HPI:    Kiara Leach  is a 43 y.o. female,  w asthma, likely OSA but not on Cpap, w recent admission for Asthma exacerbation 10/25/19-10/29/19, presents for dyspnea.  Pt notes that she personally doesn't smoke but members in her household do.  Pt notes cough w yellow sputum.  Pt denies fever, chills, cp, palp, n/v, abd pain, diarrhea, brbpr, black stool. No covid exposure.     In ED,  T 98.1, P 86  R 19, Bp 99% on 3L [place]  CTA chest IMPRESSION: Examination is somewhat limited by marginal contrast bolus and motion artifact. Within this limitation, no evidence of pulmonary embolism through the segmental pulmonary arterial level.  CXR IMPRESSION: No active disease.  Wbc 25.2, Hgb 14.2, Plt 408 Na 137, K 4.5,  Hco3 20 AG 12 Bun <5, Creatinine 0.70 Ast 19, Alt 28 Urinalysis engative  POC Sars negative  Pt given albuterol 8 puff, and duoneb x1 and lorazepam 61m iv x1 without relief.   Pt will be admitted for asthma exacerbation.      Review of systems:    In addition to the HPI above,  No Fever-chills, No Headache, No changes with Vision or hearing, No problems swallowing food or Liquids, No Chest pain,   No Abdominal pain, No Nausea or Vomiting, bowel movements are regular, No Blood in stool or Urine, No dysuria, No new skin rashes or bruises, No new joints pains-aches,  No new weakness, tingling, numbness in any extremity, No recent weight gain or loss, No polyuria, polydypsia or polyphagia, No significant Mental Stressors.  All other systems reviewed and are negative.    Past History of the  following :    Past Medical History:  Diagnosis Date  . Asthma   . Back pain       Past Surgical History:  Procedure Laterality Date  . CESAREAN SECTION    . CHOLECYSTECTOMY    . DIAGNOSTIC LAPAROSCOPY WITH REMOVAL OF ECTOPIC PREGNANCY N/A 12/21/2016   Procedure: DIAGNOSTIC LAPAROSCOPY WITH RIGHT SALPINGECTOMY;  Surgeon: JJonnie Kind MD;  [facility];  Service: Gynecology;  Laterality: N/A;  . LAPAROSCOPIC LYSIS OF ADHESIONS  12/21/2016   Procedure: LAPAROSCOPIC LYSIS OF ADHESIONS;  Surgeon: JJonnie Kind MD;  [facility];  Service: Gynecology;;      Social History:      Social History   Tobacco Use  . Smoking status: Former SResearch scientist (life sciences) . Smokeless tobacco: Never Used  Substance Use Topics  . Alcohol use: No    Alcohol/week: 0.0 standard drinks       Family History :     Family History  Problem Relation Age of Onset  . Asthma Brother  Home Medications:   Prior to Admission medications   Medication Sig Start Date End Date Taking? Authorizing Provider  albuterol (VENTOLIN HFA) 108 (90 Base) MCG/ACT inhaler Inhale 2 puffs into the lungs every 4 (four) hours as needed for wheezing or cough. 09/19/19  Yes [provider]  azithromycin (ZITHROMAX) 250 MG tablet Take 250 mg by mouth daily.   Yes [provider]  fluticasone (FLONASE) 50 MCG/ACT nasal spray Place 2 sprays into both nostrils daily as needed for allergies or rhinitis.  08/20/19  Yes [provider]  gabapentin (NEURONTIN) 300 MG capsule Take 300 mg by mouth 3 (three) times daily. 10/22/19  Yes [provider]  ipratropium-albuterol (DUONEB) 0.5-2.5 (3) MG/3ML SOLN Take 3 mLs by nebulization every 6 (six) hours. 10/28/19  Yes Little Ishikawa, MD  meloxicam (MOBIC) 7.5 MG tablet Take 7.5 mg by mouth 2 (two) times daily as needed for pain.   Yes [provider]  predniSONE (DELTASONE) 10 MG tablet Take 4 tablets (40 mg total) by mouth daily for 3  days, THEN 3 tablets (30 mg total) daily for 3 days, THEN 2 tablets (20 mg total) daily for 3 days, THEN 1 tablet (10 mg total) daily for 3 days. 10/28/19 11/09/19 Yes Little Ishikawa, MD  tiZANidine (ZANAFLEX) 4 MG tablet Take 4 mg by mouth 3 (three) times daily as needed for muscle spasms. 10/22/19  Yes [provider]  guaiFENesin-dextromethorphan (ROBITUSSIN DM) 100-10 MG/5ML syrup Take 5 mLs by mouth every 4 (four) hours as needed for cough. Patient not taking: Reported on 11/02/2019 10/28/19   Little Ishikawa, MD     Allergies:     Allergies  Allergen Reactions  . Latex Itching  . Morphine And Related Hives and Itching  . Ibuprofen Hives, Itching and Rash     Physical Exam:   Vitals  Blood pressure (!) 139/95, pulse (!) 104, temperature 98.1 F (36.7 C), temperature source Oral, resp. rate (!) 22, SpO2 97 %, unknown if currently breastfeeding.  1.  General: axoxo3  2. Psychiatric: euthymic  3. Neurologic: nonfocal  4. HEENMT:  Anciteric, pupils 1.10m symmetric, direct consensual intact Neck: no jvd  5. Respiratory : + bilateral exp wheezing, no crackles.   6. Cardiovascular : rrr s1, s2, no m/g/r  7. Gastrointestinal:  Abd: soft, morbidly obese, nt, nd, +bs  8. Skin:  Ext: no c/c/e, no rash  9.Musculoskeletal:  Good ROM    Data Review:    CBC Recent Labs  Lab 10/28/19 0348 10/29/19 0233 11/02/19 1700 11/02/19 1713  WBC 16.2* 16.9* 25.2*  --   HGB 12.9 13.6 14.2 15.0  HCT 39.9 42.3 44.6 44.0  PLT 412* 427* 408*  --   MCV 72.2* 72.6* 74.0*  --   MCH 23.3* 23.3* 23.5*  --   MCHC 32.3 32.2 31.8  --   RDW 15.9* 15.9* 17.1*  --   LYMPHSABS  --   --  1.4  --   MONOABS  --   --  0.8  --   EOSABS  --   --  0.2  --   BASOSABS  --   --  0.1  --    ------------------------------------------------------------------------------------------------------------------  Results for orders placed or performed during the hospital encounter  of 11/02/19 (from the past 48 hour(s))  CBC with Differential     Status: Abnormal   Collection Time: 11/02/19  5:00 PM  Result Value Ref Range   WBC 25.2 (H) 4.0 -  10.5 K/uL   RBC 6.03 (H) 3.87 - 5.11 MIL/uL   Hemoglobin 14.2 12.0 - 15.0 g/dL   HCT 44.6 36.0 - 46.0 %   MCV 74.0 (L) 80.0 - 100.0 fL   MCH 23.5 (L) 26.0 - 34.0 pg   MCHC 31.8 30.0 - 36.0 g/dL   RDW 17.1 (H) 11.5 - 15.5 %   Platelets 408 (H) 150 - 400 K/uL   nRBC 0.0 0.0 - 0.2 %   Neutrophils Relative % 89 %   Neutro Abs 22.4 (H) 1.7 - 7.7 K/uL   Lymphocytes Relative 6 %   Lymphs Abs 1.4 0.7 - 4.0 K/uL   Monocytes Relative 3 %   Monocytes Absolute 0.8 0.1 - 1.0 K/uL   Eosinophils Relative 1 %   Eosinophils Absolute 0.2 0.0 - 0.5 K/uL   Basophils Relative 0 %   Basophils Absolute 0.1 0.0 - 0.1 K/uL   RBC Morphology MORPHOLOGY UNREMARKABLE    Immature Granulocytes 1 %   Abs Immature Granulocytes 0.19 (H) 0.00 - 0.07 K/uL    Comment: Performed at [facility], [address], [place], [place] [address]  Comprehensive metabolic panel     Status: Abnormal   Collection Time: 11/02/19  5:00 PM  Result Value Ref Range   Sodium 137 135 - 145 mmol/L   Potassium 4.5 3.5 - 5.1 mmol/L   Chloride 105 98 - 111 mmol/L   CO2 20 (L) 22 - 32 mmol/L   Glucose, Bld 157 (H) 70 - 99 mg/dL   BUN <5 (L) 6 - 20 mg/dL   Creatinine, Ser 0.70 0.44 - 1.00 mg/dL   Calcium 9.0 8.9 - 10.3 mg/dL   Total Protein 7.3 6.5 - 8.1 g/dL   Albumin 3.6 3.5 - 5.0 g/dL   AST 19 15 - 41 U/L   ALT 28 0 - 44 U/L   Alkaline Phosphatase 100 38 - 126 U/L   Total Bilirubin 0.6 0.3 - 1.2 mg/dL   GFR calc non Af Amer >60 >60 mL/min   GFR calc Af Amer >60 >60 mL/min   Anion gap 12 5 - 15    Comment: Performed at [facility] [address] [place], [place] [address]  D-dimer, quantitative (not at [facility])     Status: Abnormal   Collection Time: 11/02/19  5:00 PM  Result Value Ref Range   D-Dimer, Quant 1.58 (H) 0.00 - 0.50 ug/mL-FEU    Comment:  (NOTE) At the manufacturer cut-off of 0.50 ug/mL FEU, this assay has been documented to exclude PE with a sensitivity and negative predictive value of 97 to 99%.  At this time, this assay has not been approved by the FDA to exclude DVT/VTE. Results should be correlated with clinical presentation. Performed at [facility], [place] [address] [place], [place] [address]   I-Stat Beta hCG blood, ED ([facility], [facility], [facility] only)     Status: None   Collection Time: 11/02/19  5:10 PM  Result Value Ref Range   I-stat hCG, quantitative <5.0 <5 mIU/mL   Comment 3            Comment:   GEST. AGE      CONC.  (mIU/mL)   <=1 WEEK        5 - 50     2 WEEKS       50 - 500     3 WEEKS       100 - 10,000  4 WEEKS     1,000 - 30,000        FEMALE AND NON-PREGNANT FEMALE:     LESS THAN 5 mIU/mL   POCT I-Stat EG7     Status: Abnormal   Collection Time: 11/02/19  5:13 PM  Result Value Ref Range   pH, Ven 7.432 (H) 7.250 - 7.430   pCO2, Ven 34.2 (L) 44.0 - 60.0 mmHg   pO2, Ven 113.0 (H) 32.0 - 45.0 mmHg   Bicarbonate 22.8 20.0 - 28.0 mmol/L   TCO2 24 22 - 32 mmol/L   O2 Saturation 99.0 %   Acid-base deficit 1.0 0.0 - 2.0 mmol/L   Sodium 136 135 - 145 mmol/L   Potassium 4.3 3.5 - 5.1 mmol/L   Calcium, Ion 1.09 (L) 1.15 - 1.40 mmol/L   HCT 44.0 36.0 - 46.0 %   Hemoglobin 15.0 12.0 - 15.0 g/dL   Patient temperature HIDE    Sample type VENOUS   POC SARS Coronavirus 2 Ag-ED - Nasal Swab (BD Veritor Kit)     Status: None   Collection Time: 11/02/19  5:14 PM  Result Value Ref Range   SARS Coronavirus 2 Ag NEGATIVE NEGATIVE    Comment: (NOTE) SARS-CoV-2 antigen NOT DETECTED.  Negative results are presumptive.  Negative results do not preclude SARS-CoV-2 infection and should not be used as the sole basis for treatment or other patient management decisions, including infection  control decisions, particularly in the presence of clinical signs and  symptoms consistent with COVID-19, or in those who have  been in contact with the virus.  Negative results must be combined with clinical observations, patient history, and epidemiological information. The expected result is Negative. Fact Sheet for Patients: PodPark.tn Fact Sheet for Healthcare Providers: GiftContent.is This test is not yet approved or cleared by the [place] FDA and  has been authorized for detection and/or diagnosis of SARS-CoV-2 by FDA under an Emergency Use Authorization (EUA).  This EUA will remain in effect (meaning this test can be used) for the duration of  the COVID-19 de claration under Section 564(b)(1) of the Act, 21 U.S.C. section 360bbb-3(b)(1), unless the authorization is terminated or revoked sooner.   Urinalysis, Routine w reflex microscopic     Status: Abnormal   Collection Time: 11/02/19  5:47 PM  Result Value Ref Range   Color, Urine YELLOW YELLOW   APPearance CLEAR CLEAR   Specific Gravity, Urine 1.008 1.005 - 1.030   pH 6.0 5.0 - 8.0   Glucose, UA NEGATIVE NEGATIVE mg/dL   Hgb urine dipstick MODERATE (A) NEGATIVE   Bilirubin Urine NEGATIVE NEGATIVE   Ketones, ur 5 (A) NEGATIVE mg/dL   Protein, ur NEGATIVE NEGATIVE mg/dL   Nitrite POSITIVE (A) NEGATIVE   Leukocytes,Ua NEGATIVE NEGATIVE   RBC / HPF 0-5 0 - 5 RBC/hpf   WBC, UA 0-5 0 - 5 WBC/hpf   Bacteria, UA NONE SEEN NONE SEEN   Squamous Epithelial / LPF 0-5 0 - 5    Comment: Performed at [facility], [place] [address], [place], [place] [address]    Chemistries  Recent Labs  Lab 10/28/19 [phone] 10/29/19 0233 11/02/19 1700 11/02/19 1713  NA 139 137 137 136  K 4.3 4.0 4.5 4.3  CL 106 105 105  --   CO2 24 22 20*  --   GLUCOSE 149* 157* 157*  --   BUN 13 14 <5*  --   CREATININE 0.76 0.84 0.70  --   CALCIUM  8.9 8.7* 9.0  --   AST  --   --  19  --   ALT  --   --  28  --   ALKPHOS  --   --  100  --   BILITOT  --   --  0.6  --     ------------------------------------------------------------------------------------------------------------------  ------------------------------------------------------------------------------------------------------------------ GFR: Estimated Creatinine Clearance: 125.5 mL/min (by C-G formula based on SCr of 0.7 mg/dL). Liver Function Tests: Recent Labs  Lab 11/02/19 1700  AST 19  ALT 28  ALKPHOS 100  BILITOT 0.6  PROT 7.3  ALBUMIN 3.6   No results for input(s): LIPASE, AMYLASE in the last 168 hours. No results for input(s): AMMONIA in the last 168 hours. Coagulation Profile: No results for input(s): INR, PROTIME in the last 168 hours. Cardiac Enzymes: No results for input(s): CKTOTAL, CKMB, CKMBINDEX, TROPONINI in the last 168 hours. BNP (last 3 results) No results for input(s): PROBNP in the last 8760 hours. HbA1C: No results for input(s): HGBA1C in the last 72 hours. CBG: No results for input(s): GLUCAP in the last 168 hours. Lipid Profile: No results for input(s): CHOL, HDL, LDLCALC, TRIG, CHOLHDL, LDLDIRECT in the last 72 hours. Thyroid Function Tests: No results for input(s): TSH, T4TOTAL, FREET4, T3FREE, THYROIDAB in the last 72 hours. Anemia Panel: No results for input(s): VITAMINB12, FOLATE, FERRITIN, TIBC, IRON, RETICCTPCT in the last 72 hours.  --------------------------------------------------------------------------------------------------------------- Urine analysis:    Component Value Date/Time   COLORURINE YELLOW 11/02/2019 1747   APPEARANCEUR CLEAR 11/02/2019 1747   LABSPEC 1.008 11/02/2019 1747   PHURINE 6.0 11/02/2019 1747   GLUCOSEU NEGATIVE 11/02/2019 1747   HGBUR MODERATE (A) 11/02/2019 1747   BILIRUBINUR NEGATIVE 11/02/2019 1747   KETONESUR 5 (A) 11/02/2019 1747   PROTEINUR NEGATIVE 11/02/2019 1747   NITRITE POSITIVE (A) 11/02/2019 1747   LEUKOCYTESUR NEGATIVE 11/02/2019 1747      Imaging Results:    CT Angio Chest PE W/Cm &/Or Wo  Cm  Result Date: 11/02/2019 CLINICAL DATA:  Shortness of breath EXAM: CT ANGIOGRAPHY CHEST WITH CONTRAST TECHNIQUE: Multidetector CT imaging of the chest was performed using the standard protocol during bolus administration of intravenous contrast. Multiplanar CT image reconstructions and MIPs were obtained to evaluate the vascular anatomy. CONTRAST:  135m OMNIPAQUE IOHEXOL 350 MG/ML SOLN COMPARISON:  None. FINDINGS: Cardiovascular: Examination is somewhat limited by marginal contrast bolus and motion artifact. Within this limitation, no evidence of pulmonary embolism through the segmental pulmonary arterial level. No evidence of pulmonary embolism. Normal heart size. No pericardial effusion. Mediastinum/Nodes: No enlarged mediastinal, hilar, or axillary lymph nodes. Thyroid gland, trachea, and esophagus demonstrate no significant findings. Lungs/Pleura: Lungs are clear. No pleural effusion or pneumothorax. Upper Abdomen: No acute abnormality. Musculoskeletal: No chest wall abnormality. No acute or significant osseous findings. Review of the MIP images confirms the above findings. IMPRESSION: Examination is somewhat limited by marginal contrast bolus and motion artifact. Within this limitation, no evidence of pulmonary embolism through the segmental pulmonary arterial level. Electronically Signed   By: AEddie CandleM.D.   On: 11/02/2019 21:30   DG Chest Portable 1 View  Result Date: 11/02/2019 CLINICAL DATA:  Pt to ED for severe SOB, pt unable to communicate. Pt hx asthma. Pt tried inhaler 4 times with no relief. Pt is a former smoker EXAM: PORTABLE CHEST 1 VIEW COMPARISON:  10/27/2019. FINDINGS: Cardiac silhouette is normal in size. No mediastinal or hilar masses. No evidence of adenopathy. Clear lungs.  No pleural effusion or pneumothorax. Skeletal  structures are grossly intact. IMPRESSION: No active disease. Electronically Signed   By: Lajean Manes M.D.   On: 11/02/2019 17:12       Assessment &  Plan:    Active Problems:   Asthma exacerbation  Asthma exacerbation Solumedrol 35m iv q8h Zithromax 5046miv qday Start Dulera 2puff bid Cont Duoneb q6h Cont Albuterol HFA 2puff q4h prn   Pt counselled to tell her family to stop smoking   DVT Prophylaxis-   Lovenox - SCDs   AM Labs Ordered, also please review Full Orders  Family Communication: Admission, patients condition and plan of care including tests being ordered have been discussed with the patient  who indicate understanding and agree with the plan and Code Status.  Code Status:  FULL CODE per patient, [facility]   Admission status: Observationt: Based on patients clinical presentation and evaluation of above clinical data, I have made determination that patient meets observation criteria at this time.   If not improving might need inpatient stay.   Time spent in minutes : 55 minutes   JaJani Gravel.D on 11/02/2019 at 10:37 PM

## 2019-11-02 NOTE — ED Notes (Signed)
Called RT and reported pt condition to EDP's

## 2019-11-02 NOTE — ED Notes (Signed)
Requested assistance from phlebotomy to obtain blood from this pt.

## 2019-11-02 NOTE — ED Notes (Signed)
Patient transported to CT 

## 2019-11-02 NOTE — ED Notes (Signed)
IV team remains at bedside 

## 2019-11-03 ENCOUNTER — Encounter (HOSPITAL_COMMUNITY): Payer: Self-pay | Admitting: Internal Medicine

## 2019-11-03 DIAGNOSIS — Z6841 Body Mass Index (BMI) 40.0 and over, adult: Secondary | ICD-10-CM | POA: Diagnosis not present

## 2019-11-03 DIAGNOSIS — Z885 Allergy status to narcotic agent status: Secondary | ICD-10-CM | POA: Diagnosis not present

## 2019-11-03 DIAGNOSIS — R7303 Prediabetes: Secondary | ICD-10-CM | POA: Diagnosis present

## 2019-11-03 DIAGNOSIS — I10 Essential (primary) hypertension: Secondary | ICD-10-CM | POA: Diagnosis present

## 2019-11-03 DIAGNOSIS — Z87891 Personal history of nicotine dependence: Secondary | ICD-10-CM | POA: Diagnosis not present

## 2019-11-03 DIAGNOSIS — Z79899 Other long term (current) drug therapy: Secondary | ICD-10-CM | POA: Diagnosis not present

## 2019-11-03 DIAGNOSIS — J45901 Unspecified asthma with (acute) exacerbation: Secondary | ICD-10-CM | POA: Diagnosis present

## 2019-11-03 DIAGNOSIS — J4551 Severe persistent asthma with (acute) exacerbation: Secondary | ICD-10-CM | POA: Diagnosis not present

## 2019-11-03 DIAGNOSIS — Z888 Allergy status to other drugs, medicaments and biological substances status: Secondary | ICD-10-CM | POA: Diagnosis not present

## 2019-11-03 DIAGNOSIS — J9601 Acute respiratory failure with hypoxia: Secondary | ICD-10-CM | POA: Diagnosis present

## 2019-11-03 DIAGNOSIS — D72829 Elevated white blood cell count, unspecified: Secondary | ICD-10-CM

## 2019-11-03 DIAGNOSIS — Z7952 Long term (current) use of systemic steroids: Secondary | ICD-10-CM | POA: Diagnosis not present

## 2019-11-03 DIAGNOSIS — Z825 Family history of asthma and other chronic lower respiratory diseases: Secondary | ICD-10-CM | POA: Diagnosis not present

## 2019-11-03 DIAGNOSIS — Z20828 Contact with and (suspected) exposure to other viral communicable diseases: Secondary | ICD-10-CM | POA: Diagnosis present

## 2019-11-03 DIAGNOSIS — Z91040 Latex allergy status: Secondary | ICD-10-CM | POA: Diagnosis not present

## 2019-11-03 DIAGNOSIS — R0602 Shortness of breath: Secondary | ICD-10-CM | POA: Diagnosis present

## 2019-11-03 LAB — COMPREHENSIVE METABOLIC PANEL
ALT: 25 U/L (ref 0–44)
AST: 16 U/L (ref 15–41)
Albumin: 3.2 g/dL — ABNORMAL LOW (ref 3.5–5.0)
Alkaline Phosphatase: 98 U/L (ref 38–126)
Anion gap: 11 (ref 5–15)
BUN: 5 mg/dL — ABNORMAL LOW (ref 6–20)
CO2: 21 mmol/L — ABNORMAL LOW (ref 22–32)
Calcium: 9.2 mg/dL (ref 8.9–10.3)
Chloride: 106 mmol/L (ref 98–111)
Creatinine, Ser: 0.62 mg/dL (ref 0.44–1.00)
GFR calc Af Amer: >60 mL/min (ref >60)
GFR calc non Af Amer: >60 mL/min (ref >60)
Glucose, Bld: 193 mg/dL — ABNORMAL HIGH (ref 70–99)
Potassium: 4.3 mmol/L (ref 3.5–5.1)
Sodium: 138 mmol/L (ref 135–145)
Total Bilirubin: 0.6 mg/dL (ref 0.3–1.2)
Total Protein: 6.9 g/dL (ref 6.5–8.1)

## 2019-11-03 LAB — CBC
HCT: 40.8 % (ref 36.0–46.0)
Hemoglobin: 13.5 g/dL (ref 12.0–15.0)
MCH: 23.7 pg — ABNORMAL LOW (ref 26.0–34.0)
MCHC: 33.1 g/dL (ref 30.0–36.0)
MCV: 71.7 fL — ABNORMAL LOW (ref 80.0–100.0)
Platelets: 396 10*3/uL (ref 150–400)
RBC: 5.69 MIL/uL — ABNORMAL HIGH (ref 3.87–5.11)
RDW: 16.7 % — ABNORMAL HIGH (ref 11.5–15.5)
WBC: 23.3 10*3/uL — ABNORMAL HIGH (ref 4.0–10.5)
nRBC: 0 % (ref 0.0–0.2)

## 2019-11-03 LAB — MRSA PCR SCREENING: MRSA by PCR: NEGATIVE

## 2019-11-03 LAB — HEMOGLOBIN A1C
Hgb A1c MFr Bld: 6.2 % — ABNORMAL HIGH (ref 4.8–5.6)
Mean Plasma Glucose: 131.24 mg/dL

## 2019-11-03 LAB — GLUCOSE, CAPILLARY
Glucose-Capillary: 205 mg/dL — ABNORMAL HIGH (ref 70–99)
Glucose-Capillary: 157 mg/dL — ABNORMAL HIGH (ref 70–99)

## 2019-11-03 LAB — PROCALCITONIN: Procalcitonin: <0.1 ng/mL

## 2019-11-03 MED ORDER — INSULIN ASPART 100 UNIT/ML ~~LOC~~ SOLN
0.0000 [IU] | Freq: Three times a day (TID) | SUBCUTANEOUS | Status: DC
  Administered 2019-11-03: 3 [IU] via SUBCUTANEOUS
  Administered 2019-11-04 (×2): 1 [IU] via SUBCUTANEOUS

## 2019-11-03 MED ORDER — TIZANIDINE HCL 4 MG PO TABS: 4.0000 mg | ORAL_TABLET | Freq: Three times a day (TID) | ORAL | Status: DC | PRN

## 2019-11-03 MED ORDER — MOMETASONE FURO-FORMOTEROL FUM 200-5 MCG/ACT IN AERO
2.0000 | INHALATION_SPRAY | Freq: Two times a day (BID) | RESPIRATORY_TRACT | Status: DC
  Administered 2019-11-03 – 2019-11-04 (×3): 2 via RESPIRATORY_TRACT
  Filled 2019-11-03: qty 8.8

## 2019-11-03 MED ORDER — IPRATROPIUM-ALBUTEROL 0.5-2.5 (3) MG/3ML IN SOLN
3.0000 mL | Freq: Four times a day (QID) | RESPIRATORY_TRACT | Status: DC
  Administered 2019-11-03 (×4): 3 mL via RESPIRATORY_TRACT
  Filled 2019-11-03 (×3): qty 3

## 2019-11-03 MED ORDER — GABAPENTIN 300 MG PO CAPS
300.0000 mg | ORAL_CAPSULE | Freq: Three times a day (TID) | ORAL | Status: DC
  Administered 2019-11-03 – 2019-11-04 (×4): 300 mg via ORAL
  Filled 2019-11-03 (×4): qty 1

## 2019-11-03 MED ORDER — GUAIFENESIN-DM 100-10 MG/5ML PO SYRP
5.0000 mL | ORAL_SOLUTION | ORAL | Status: DC | PRN
  Administered 2019-11-03 – 2019-11-04 (×2): 5 mL via ORAL
  Filled 2019-11-03 (×2): qty 5

## 2019-11-03 MED ORDER — SODIUM CHLORIDE 0.9 % IV SOLN
500.0000 mg | INTRAVENOUS | Status: DC
  Administered 2019-11-03 – 2019-11-04 (×2): 500 mg via INTRAVENOUS
  Filled 2019-11-03 (×2): qty 500

## 2019-11-03 MED ORDER — FLUTICASONE PROPIONATE 50 MCG/ACT NA SUSP: 2.0000 | Freq: Every day | NASAL | Status: DC | PRN

## 2019-11-03 MED ORDER — GERHARDT'S BUTT CREAM
TOPICAL_CREAM | CUTANEOUS | Status: DC | PRN
  Filled 2019-11-03: qty 1

## 2019-11-03 MED ORDER — ORAL CARE MOUTH RINSE
15.0000 mL | Freq: Two times a day (BID) | OROMUCOSAL | Status: DC
  Administered 2019-11-03 (×2): 15 mL via OROMUCOSAL

## 2019-11-03 MED ORDER — ALBUTEROL SULFATE (2.5 MG/3ML) 0.083% IN NEBU: 3.0000 mL | INHALATION_SOLUTION | RESPIRATORY_TRACT | Status: DC | PRN

## 2019-11-03 MED ORDER — IPRATROPIUM-ALBUTEROL 0.5-2.5 (3) MG/3ML IN SOLN
3.0000 mL | Freq: Three times a day (TID) | RESPIRATORY_TRACT | Status: DC
  Administered 2019-11-04: 3 mL via RESPIRATORY_TRACT
  Filled 2019-11-03: qty 3

## 2019-11-03 NOTE — Evaluation (Signed)
Physical Therapy Evaluation Patient Details Name: Kiara Leach MRN: [id number] DOB: Jul 07, 1976 Today's Date: 11/03/2019   History of Present Illness  Patient is a 43 y/o female who presents with dyspnea. Recent admission for asthma exacerbation 12/5-12/9. Admitted with acute respiratory failure secondary to asthma exacerbation. PMH includes asthma, back pain and obesity.  Clinical Impression  Patient presents with generalized weakness, hip pain, decreased activity tolerance, impaired balance and impaired mobility s/p above. Pt reports since recent d/c on 12/9, she has not been doing that well. Her 78 and 33 y/o daughters assist with all mobility and care. She has limited mobility due to DOE and bil hip pain. Today, pt tolerated standing and taking a few steps along side bed with min-mod A for balance/safety. HR up to 125 bpm and RR up to 39. Sp02 remained >92% on RA. Pt with productive coughing spells. Rn notified. Will follow acutely to maximize independence and mobility prior to return home.    Follow Up Recommendations Home health PT;Supervision/Assistance - 24 hour    Equipment Recommendations  3in1 (PT)    Recommendations for Other Services       Precautions / Restrictions Precautions Precautions: Fall Precaution Comments: watch HR/RR Restrictions Weight Bearing Restrictions: No      Mobility  Bed Mobility Overal bed mobility: Needs Assistance Bed Mobility: Supine to Sit;Sit to Supine     Supine to sit: Mod assist;HOB elevated Sit to supine: Mod assist;HOB elevated   General bed mobility comments: Pt reaching for therapist's hand to pull trunk into sitting; assist to bring LES into bed to return to supine.  Transfers Overall transfer level: Needs assistance Equipment used: Rolling walker (2 wheeled) Transfers: Sit to/from Stand Sit to Stand: Min assist         General transfer comment: minA to reach full stand and VCs for hand  placement.  Ambulation/Gait Ambulation/Gait assistance: Min guard Gait Distance (Feet): 2 Feet Assistive device: Rolling walker (2 wheeled) Gait Pattern/deviations: Trunk flexed;Shuffle;Wide base of support;Step-to pattern     General Gait Details: Able to side step along side bed with increased effort/difficulty clearing BLE. Significant trunk lean on RW for support. Limited due to bil hip pain and coughing fit.  Stairs            Wheelchair Mobility    Modified Rankin (Stroke Patients Only)       Balance Overall balance assessment: Needs assistance Sitting-balance support: Feet supported;No upper extremity supported Sitting balance-Leahy Scale: Fair     Standing balance support: During functional activity Standing balance-Leahy Scale: Poor Standing balance comment: Reliant on BUEs for support in standing.                             Pertinent Vitals/Pain Pain Assessment: Faces Faces Pain Scale: Hurts even more Pain Location: B hips Pain Descriptors / Indicators: Sore;Grimacing;Guarding Pain Intervention(s): Repositioned;Monitored during session;Limited activity within patient's tolerance    Home Living Family/patient expects to be discharged to:: Private residence Living Arrangements: Children(20 y/o (pregnant) and 50 y/o) Available Help at Discharge: Family;Available 24 hours/day Type of Home: House Home Access: Stairs to enter Entrance Stairs-Rails: Left Entrance Stairs-Number of Steps: 6 STE with rail on L Home Layout: Able to live on main level with bedroom/bathroom;Two level Home Equipment: Walker - 2 wheels;Cane - single point;Hand held shower head Additional Comments: pt has recliner and sometimes sleeps there    Prior Function Level of Independence: Needs assistance   Gait /  Transfers Assistance Needed: daughters help pt amb in home, daughters perform all driving and errands  ADL's / Homemaking Assistance Needed: performed by  daughters  Comments: Very limited with mobility due to hip pain (gets injections and needs hip replacements) and dyspnea.     Hand Dominance   Dominant Hand: Right    Extremity/Trunk Assessment   Upper Extremity Assessment Upper Extremity Assessment: Defer to OT evaluation    Lower Extremity Assessment Lower Extremity Assessment: Generalized weakness(Limited movement due to pain in hips bilaterally)    Cervical / Trunk Assessment Cervical / Trunk Assessment: Normal  Communication   Communication: No difficulties  Cognition Arousal/Alertness: Awake/alert Behavior During Therapy: WFL for tasks assessed/performed Overall Cognitive Status: Within Functional Limits for tasks assessed                                 General Comments: tearful during session describing how things were going at home and how much she is not able to do on her own/has to rely on her daughters.      General Comments General comments (skin integrity, edema, etc.): HR up to 125 bpm esp worsened with coughing fits . RR up to 39. Productive cough, RN aware.    Exercises     Assessment/Plan    PT Assessment Patient needs continued PT services  PT Problem List Decreased strength;Decreased mobility;Obesity;Decreased activity tolerance;Cardiopulmonary status limiting activity;Decreased balance;Pain       PT Treatment Interventions Stair training;Gait training;DME instruction;Balance training;Therapeutic exercise;Functional mobility training;Therapeutic activities;Patient/family education    PT Goals (Current goals can be found in the Care Plan section)  Acute Rehab PT Goals Patient Stated Goal: to get better PT Goal Formulation: With patient Time For Goal Achievement: 11/17/19 Potential to Achieve Goals: Good    Frequency Min 3X/week   Barriers to discharge Inaccessible home environment stairs to enter home    Co-evaluation               AM-PAC PT "6 Clicks" Mobility   Outcome Measure Help needed turning from your back to your side while in a flat bed without using bedrails?: A Little Help needed moving from lying on your back to sitting on the side of a flat bed without using bedrails?: A Lot Help needed moving to and from a bed to a chair (including a wheelchair)?: A Lot Help needed standing up from a chair using your arms (e.g., wheelchair or bedside chair)?: A Lot Help needed to walk in hospital room?: A Lot Help needed climbing 3-5 steps with a railing? : Total 6 Click Score: 12    End of Session   Activity Tolerance: Treatment limited secondary to medical complications (Comment)(hip pain, coughing fit) Patient left: in bed;with call bell/phone within reach;with nursing/sitter in room Nurse Communication: Mobility status;Other (comment)(HR and RR change) PT Visit Diagnosis: Pain;Muscle weakness (generalized) (M62.81);Difficulty in walking, not elsewhere classified (R26.2);Other abnormalities of gait and mobility (R26.89) Pain - Right/Left: Left Pain - part of body: Hip    Time: 0177-9390 PT Time Calculation (min) (ACUTE ONLY): 23 min   Charges:   PT Evaluation $PT Eval Moderate Complexity: 1 Mod PT Treatments $Therapeutic Activity: 8-22 mins        Vale Haven, PT, DPT Acute Rehabilitation Services Pager [phone] Office [phone]      Blake Divine A Lanier Ensign 11/03/2019, 2:46 PM

## 2019-11-03 NOTE — Progress Notes (Addendum)
PROGRESS NOTE    Kiara Leach  TXM:[id number] DOB: 1976/10/24 DOA: 11/02/2019 PCP: Gean Birchwood, [facility]   Brief Narrative:  Kiara Leach  is a 43 y.o. female,  w asthma, likely OSA but not on Cpap, w recent admission for Asthma exacerbation 10/25/19-10/29/19, presented with shortness of breath.  Pt notes that she personally doesn't smoke but members in her household do.  Pt notes cough w yellow sputum.  No fever or any other complaint.  She was requiring 3 L of nasal oxygen in the ED.  She was hemodynamically stable otherwise.  D-dimer was elevated.  Chest x-ray was unremarkable per CT angio of the chest to rule out PE.  Once again she had leukocytosis with white cells of 25.  Her white cells were 16.9 at the time of discharge but she continued to have leukocytosis during recent hospitalization as well with no signs of infection.  She was admitted at this time once again with acute asthma exacerbation.  Assessment & Plan:   Active Problems:   Acute respiratory failure with hypoxia (HCC)   Asthma exacerbation   Prediabetes   Leukocytosis   Acute hypoxic respiratory failure secondary to acute asthma exacerbation: Still with shortness of breath and requiring 3 L of oxygen.  Dropping to 80% on room air with activity.  Continue Solu-Medrol.  As needed DuoNeb and albuterol.  Leukocytosis: Slightly better than yesterday but has persistent leukocytosis with no signs of infection and she is afebrile.  This could very well be just due to being on steroids.  Will check procalcitonin.  Repeat CBC in the morning.  Prediabetes vs steroid-induced hyperglycemia: Came in with hyperglycemia.  Hemoglobin A1c 6.2.  While on steroids, start on SSI.  DVT prophylaxis: Lovenox Code Status: Full code Family Communication:  None present at bedside.  Plan of care discussed with patient in length and he verbalized understanding and agreed with it. Disposition Plan: To be determined.  Potential discharge in next 1 to 2  days  Estimated body mass index is 58.19 kg/m as calculated from the following:   Height as of this encounter: 5\' 2"  (1.575 m).   Weight as of this encounter: 144.3 kg.      Nutritional status:               Consultants:   None  Procedures:   None  Antimicrobials:   Azithromycin   Subjective: Seen and examined.  Continues to complain of shortness of breath but improved compared to yesterday.  No new complaint.  Objective: Vitals:   11/03/19 0759 11/03/19 0802 11/03/19 1000 11/03/19 1203  BP: 124/82 124/82  (!) 142/92  Pulse: 93 (!) 105 (!) 102 (!) 113  Resp: 18 (!) 22 18 16   Temp: 98.2 F (36.8 C)     TempSrc: Oral     SpO2: 97% 98%  95%  Weight:      Height:        Intake/Output Summary (Last 24 hours) at 11/03/2019 1259 Last data filed at 11/03/2019 0830 Gross per 24 hour  Intake --  Output 400 ml  Net -400 ml   Filed Weights   11/03/19 0233  Weight: (!) 144.3 kg    Examination:  General exam: Appears calm and comfortable  Respiratory system: Diminished breath sounds throughout with wheezes in the right lower lobe. Respiratory effort normal. Cardiovascular system: S1 & S2 heard, RRR. No JVD, murmurs, rubs, gallops or clicks. No pedal edema. Gastrointestinal system: Abdomen is nondistended, soft and nontender. No  organomegaly or masses felt. Normal bowel sounds heard. Central nervous system: Alert and oriented. No focal neurological deficits. Extremities: Symmetric 5 x 5 power. Skin: No rashes, lesions or ulcers Psychiatry: Judgement and insight appear normal. Mood & affect appropriate.    Data Reviewed: I have personally reviewed following labs and imaging studies  CBC: Recent Labs  Lab 10/28/19 0348 10/29/19 0233 11/02/19 1700 11/02/19 1713 11/03/19 0253  WBC 16.2* 16.9* 25.2*  --  23.3*  NEUTROABS  --   --  22.4*  --   --   HGB 12.9 13.6 14.2 15.0 13.5  HCT 39.9 42.3 44.6 44.0 40.8  MCV 72.2* 72.6* 74.0*  --  71.7*  PLT  412* 427* 408*  --  376   Basic Metabolic Panel: Recent Labs  Lab 10/28/19 0348 10/29/19 0233 11/02/19 1700 11/02/19 1713 11/03/19 0253  NA 139 137 137 136 138  K 4.3 4.0 4.5 4.3 4.3  CL 106 105 105  --  106  CO2 24 22 20*  --  21*  GLUCOSE 149* 157* 157*  --  193*  BUN 13 14 <5*  --  5*  CREATININE 0.76 0.84 0.70  --  0.62  CALCIUM 8.9 8.7* 9.0  --  9.2   GFR: Estimated Creatinine Clearance: 125.7 mL/min (by C-G formula based on SCr of 0.62 mg/dL). Liver Function Tests: Recent Labs  Lab 11/02/19 1700 11/03/19 0253  AST 19 16  ALT 28 25  ALKPHOS 100 98  BILITOT 0.6 0.6  PROT 7.3 6.9  ALBUMIN 3.6 3.2*   No results for input(s): LIPASE, AMYLASE in the last 168 hours. No results for input(s): AMMONIA in the last 168 hours. Coagulation Profile: No results for input(s): INR, PROTIME in the last 168 hours. Cardiac Enzymes: No results for input(s): CKTOTAL, CKMB, CKMBINDEX, TROPONINI in the last 168 hours. BNP (last 3 results) No results for input(s): PROBNP in the last 8760 hours. HbA1C: Recent Labs    11/03/19 0253  HGBA1C 6.2*   CBG: No results for input(s): GLUCAP in the last 168 hours. Lipid Profile: No results for input(s): CHOL, HDL, LDLCALC, TRIG, CHOLHDL, LDLDIRECT in the last 72 hours. Thyroid Function Tests: No results for input(s): TSH, T4TOTAL, FREET4, T3FREE, THYROIDAB in the last 72 hours. Anemia Panel: No results for input(s): VITAMINB12, FOLATE, FERRITIN, TIBC, IRON, RETICCTPCT in the last 72 hours. Sepsis Labs: No results for input(s): PROCALCITON, LATICACIDVEN in the last 168 hours.  Recent Results (from the past 240 hour(s))  SARS CORONAVIRUS 2 (TAT 6-24 HRS) Nasopharyngeal Nasopharyngeal Swab     Status: None   Collection Time: 10/25/19  3:59 AM   Specimen: Nasopharyngeal Swab  Result Value Ref Range Status   SARS Coronavirus 2 NEGATIVE NEGATIVE Final    Comment: (NOTE) SARS-CoV-2 target nucleic acids are NOT DETECTED. The SARS-CoV-2 RNA  is generally detectable in upper and lower respiratory specimens during the acute phase of infection. Negative results do not preclude SARS-CoV-2 infection, do not rule out co-infections with other pathogens, and should not be used as the sole basis for treatment or other patient management decisions. Negative results must be combined with clinical observations, patient history, and epidemiological information. The expected result is Negative. Fact Sheet for Patients: SugarRoll.be Fact Sheet for Healthcare Providers: https://www.woods-mathews.com/ This test is not yet approved or cleared by the [place] FDA and  has been authorized for detection and/or diagnosis of SARS-CoV-2 by FDA under an Emergency Use Authorization (EUA). This EUA will remain  in effect (  meaning this test can be used) for the duration of the COVID-19 declaration under Section 56 4(b)(1) of the Act, 21 U.S.C. section 360bbb-3(b)(1), unless the authorization is terminated or revoked sooner. Performed at [facility], [address] [place], [place] [address]   MRSA PCR Screening     Status: None   Collection Time: 11/03/19  7:56 AM   Specimen: Nasal Mucosa; Nasopharyngeal  Result Value Ref Range Status   MRSA by PCR NEGATIVE NEGATIVE Final    Comment:        The GeneXpert MRSA Assay (FDA approved for NASAL specimens only), is one component of a comprehensive MRSA colonization surveillance program. It is not intended to diagnose MRSA infection nor to guide or monitor treatment for MRSA infections. Performed at [facility], [address] [place], [place] [address]       Radiology Studies: CT Angio Chest PE W/Cm &/Or Wo Cm  Result Date: 11/02/2019 CLINICAL DATA:  Shortness of breath EXAM: CT ANGIOGRAPHY CHEST WITH CONTRAST TECHNIQUE: Multidetector CT imaging of the chest was performed using the standard protocol during bolus administration of  intravenous contrast. Multiplanar CT image reconstructions and MIPs were obtained to evaluate the vascular anatomy. CONTRAST:  100mL OMNIPAQUE IOHEXOL 350 MG/ML SOLN COMPARISON:  None. FINDINGS: Cardiovascular: Examination is somewhat limited by marginal contrast bolus and motion artifact. Within this limitation, no evidence of pulmonary embolism through the segmental pulmonary arterial level. No evidence of pulmonary embolism. Normal heart size. No pericardial effusion. Mediastinum/Nodes: No enlarged mediastinal, hilar, or axillary lymph nodes. Thyroid gland, trachea, and esophagus demonstrate no significant findings. Lungs/Pleura: Lungs are clear. No pleural effusion or pneumothorax. Upper Abdomen: No acute abnormality. Musculoskeletal: No chest wall abnormality. No acute or significant osseous findings. Review of the MIP images confirms the above findings. IMPRESSION: Examination is somewhat limited by marginal contrast bolus and motion artifact. Within this limitation, no evidence of pulmonary embolism through the segmental pulmonary arterial level. Electronically Signed   By: Lauralyn PrimesAlex  Bibbey M.D.   On: 11/02/2019 21:30   DG Chest Portable 1 View  Result Date: 11/02/2019 CLINICAL DATA:  Pt to ED for severe SOB, pt unable to communicate. Pt hx asthma. Pt tried inhaler 4 times with no relief. Pt is a former smoker EXAM: PORTABLE CHEST 1 VIEW COMPARISON:  10/27/2019. FINDINGS: Cardiac silhouette is normal in size. No mediastinal or hilar masses. No evidence of adenopathy. Clear lungs.  No pleural effusion or pneumothorax. Skeletal structures are grossly intact. IMPRESSION: No active disease. Electronically Signed   By: Amie Portlandavid  Ormond M.D.   On: 11/02/2019 17:12    Scheduled Meds: . enoxaparin (LOVENOX) injection  70 mg Subcutaneous Q24H  . gabapentin  300 mg Oral TID  . insulin aspart  0-9 Units Subcutaneous TID WC  . ipratropium-albuterol  3 mL Nebulization Q6H  . mouth rinse  15 mL Mouth Rinse BID  .  methylPREDNISolone (SOLU-MEDROL) injection  80 mg Intravenous Q8H  . mometasone-formoterol  2 puff Inhalation BID  . sodium chloride flush  3 mL Intravenous Q12H   Continuous Infusions: . sodium chloride 250 mL (11/03/19 0237)  . azithromycin 500 mg (11/03/19 0241)     LOS: 0 days   Time spent: 31 minutes   Hughie Clossavi Samaj Wessells, MD [facility]  11/03/2019, 12:59 PM   To contact the attending provider between 7A-7P or the covering provider during after hours 7P-7A, please log into the web site [email or web address] and use password [email or web address].

## 2019-11-03 NOTE — ED Notes (Signed)
Pulled azithromycin and took it up with pt (no pump available).

## 2019-11-04 DIAGNOSIS — J9601 Acute respiratory failure with hypoxia: Secondary | ICD-10-CM

## 2019-11-04 LAB — GLUCOSE, CAPILLARY
Glucose-Capillary: 133 mg/dL — ABNORMAL HIGH (ref 70–99)
Glucose-Capillary: 136 mg/dL — ABNORMAL HIGH (ref 70–99)

## 2019-11-04 LAB — BASIC METABOLIC PANEL
Anion gap: 13 (ref 5–15)
BUN: 10 mg/dL (ref 6–20)
CO2: 21 mmol/L — ABNORMAL LOW (ref 22–32)
Calcium: 9.1 mg/dL (ref 8.9–10.3)
Chloride: 107 mmol/L (ref 98–111)
Creatinine, Ser: 1.01 mg/dL — ABNORMAL HIGH (ref 0.44–1.00)
GFR calc Af Amer: >60 mL/min (ref >60)
GFR calc non Af Amer: >60 mL/min (ref >60)
Glucose, Bld: 196 mg/dL — ABNORMAL HIGH (ref 70–99)
Potassium: 4 mmol/L (ref 3.5–5.1)
Sodium: 141 mmol/L (ref 135–145)

## 2019-11-04 LAB — CBC WITH DIFFERENTIAL/PLATELET
Abs Immature Granulocytes: 0 10*3/uL (ref 0.00–0.07)
Basophils Absolute: 0 10*3/uL (ref 0.0–0.1)
Basophils Relative: 0 %
Eosinophils Absolute: 0 10*3/uL (ref 0.0–0.5)
Eosinophils Relative: 0 %
HCT: 41.4 % (ref 36.0–46.0)
Hemoglobin: 13.6 g/dL (ref 12.0–15.0)
Lymphocytes Relative: 2 %
Lymphs Abs: 0.6 10*3/uL — ABNORMAL LOW (ref 0.7–4.0)
MCH: 23.8 pg — ABNORMAL LOW (ref 26.0–34.0)
MCHC: 32.9 g/dL (ref 30.0–36.0)
MCV: 72.5 fL — ABNORMAL LOW (ref 80.0–100.0)
Monocytes Absolute: 0.9 10*3/uL (ref 0.1–1.0)
Monocytes Relative: 3 %
Neutro Abs: 29.5 10*3/uL — ABNORMAL HIGH (ref 1.7–7.7)
Neutrophils Relative %: 95 %
Platelets: 433 10*3/uL — ABNORMAL HIGH (ref 150–400)
RBC: 5.71 MIL/uL — ABNORMAL HIGH (ref 3.87–5.11)
RDW: 17.1 % — ABNORMAL HIGH (ref 11.5–15.5)
WBC: 31 10*3/uL — ABNORMAL HIGH (ref 4.0–10.5)
nRBC: 0 % (ref 0.0–0.2)
nRBC: 0 /100 WBC

## 2019-11-04 MED ORDER — PREDNISONE 50 MG PO TABS: 50.0000 mg | ORAL_TABLET | Freq: Every day | ORAL | 0 refills | Status: AC

## 2019-11-04 MED ORDER — AZITHROMYCIN 500 MG PO TABS: 500.0000 mg | ORAL_TABLET | Freq: Every day | ORAL | Status: DC

## 2019-11-04 NOTE — Discharge Instructions (Signed)
Asthma Action Plan, Adult °An asthma action plan helps you understand how to manage your asthma and what to do when you have an asthma attack. The action plan is a color-coded plan that lists the symptoms that indicate whether or not your condition is under control and what actions to take. °· If you have symptoms in the green zone, it means you are doing well. °· If you have symptoms in the yellow zone, it means you are having problems. °· If you have symptoms in the red zone, you need medical care right away. °Follow the plan that you and your health care provider develop. Review your plan with your health care provider at each visit. °What triggers your asthma? °Knowing the things that can trigger an asthma attack or make your asthma symptoms worse is very important. Talk to your health care provider about your asthma triggers and how to avoid them. Record your known asthma triggers here: _______________ °What is your personal best peak flow reading? °If you use a peak flow meter, determine your personal best reading. Record it here: _______________ °Red zone °Symptoms in this zone mean that you should get medical help right away. You will likely feel distressed and have symptoms at rest that restrict your activity. You are in the red zone if: °· You are breathing hard and quickly. °· Your nose opens wide, your ribs show, and your neck muscles become visible when you breathe in. °· Your lips, fingers, or toes are a bluish color. °· You have trouble speaking in full sentences. °· Your peak flow reading is less than __________ (less than 50% of your personal best). °· Your symptoms do not improve within 15-20 minutes after you use your reliever or rescue medicine (bronchodilator). °If you have any of these symptoms: °· Call your local emergency services (911 in the [place] or go to the nearest emergency room. °· Use your reliever or rescue medicine. °? Start a nebulizer treatment or take 2-4 puffs from a metered-dose  inhaler with a spacer. °? Repeat this action every 15-20 minutes until help arrives. °Yellow zone °Symptoms in this zone mean that your condition may be getting worse. You may have symptoms that interfere with exercise, are noticeably worse after exposure to triggers, or are worse at the first sign of a cold (upper respiratory infection). These may include: °· Waking from sleep. °· Coughing, especially at night or first thing in the morning. °· Mild wheezing. °· Chest tightness. °· A peak flow reading that is __________ to __________ (50-79% of your personal best). °If you have any of these symptoms: °· Add the following medicine to the ones that you use daily: °? Reliever or rescue medicine and dosage: _______________ °? Additional medicine and dosage: _______________ °Call your health care provider if: °· You remain in the yellow zone for __________ hours. °· You are using a reliever or rescue medicine more than 2-3 times a week. °Green zone °This zone means that your asthma is under control. You may not have any symptoms while you are in the green zone. This means that you: °· Have no coughing or wheezing, even while you are working or playing. °· Sleep through the night. °· Are breathing well. °· Have a peak flow reading that is above __________ (80% of your personal best or greater). °If you are in the green zone, continue to manage your asthma as directed: °· Take these medicines every day: °? Controller medicine and dosage: _______________ °? Controller medicine and   dosage: _______________ °? Controller medicine and dosage: _______________ °? Controller medicine and dosage: _______________ °· Before exercise, use this reliever or rescue medicine: _______________ °Call your health care provider if you are using a reliever or rescue medicine more than 2-3 times a week. °Where to find more information °You can find more information about asthma from: °· Centers for Disease Control and Prevention:  www.cdc.gov/asthma °· American Lung Association: www.lung.org °This information is not intended to replace advice given to you by your health care provider. Make sure you discuss any questions you have with your health care provider. °Document Released: 09/03/2009 Document Revised: 08/20/2018 Document Reviewed: 07/18/2017 °Elsevier Patient Education © 2020 [employer] ° °

## 2019-11-04 NOTE — Plan of Care (Signed)

## 2019-11-04 NOTE — Discharge Summary (Signed)
Physician Discharge Summary  Kiara Leach ZOX:[id number] DOB: 30-Jul-1976 DOA: 11/02/2019  PCP: [facility]  Admit date: 11/02/2019 Discharge date: 11/04/2019  Admitted From: Home Disposition: Home  Recommendations for Outpatient Follow-up:  1. Follow up with PCP in 3 days 2. Follow-up with hematology as an outpatient for persistent leukocytosis since April 2017 3. Please obtain BMP/CBC in one week 4. Please follow up on the following pending results:  Home Health: Yes Equipment/Devices: None  Discharge Condition: Stable CODE STATUS: Full code Diet recommendation: Cardiac  Subjective: Seen and examined. Feels much better except some cough and white-colored sputum. No more shortness of breath. Has been off of oxygen since last night 8 PM.  Brief/Interim Summary: AishaPerezis a43 y.o.female,w asthma, likely OSA but not on Cpap, w recent admission for Asthma exacerbation 10/25/19-10/29/19, presented with shortness of breath. Pt notes that she personally doesn't smoke but members in her household do. Pt notes cough w yellow sputum.  No fever or any other complaint.  She was requiring 3 L of nasal oxygen in the ED.  She was hemodynamically stable otherwise.  D-dimer was elevated.  Chest x-ray was unremarkable per CT angio of the chest to rule out PE.  Once again she had leukocytosis with white cells of 25.  Her white cells were 16.9 at the time of discharge but she continued to have leukocytosis during recent hospitalization as well with no signs of infection. Upon extensive chart review, it seems like she has been having persistent leukocytosis since April 2017. She was admitted at this time once again with acute asthma exacerbation. She was started on Solu-Medrol and azithromycin. She was requiring 3 L of oxygen initially. She has been off of oxygen since 8 PM last night. Feeling much better today and not hypoxic anymore. Shortness of breath improved. Has some cough but she was taking  Robitussin at home which she will continue. She was seen by PT OT and they recommended home health which has been arranged for her. She is being discharged in stable condition. I have prescribed her four more days of oral prednisone. Follow-up with PCP on Friday. Also recommend follow-up with hematology for persistent leukocytosis.  Discharge Diagnoses:  Active Problems:   Acute respiratory failure with hypoxia (HCC)   Asthma exacerbation   Prediabetes   Leukocytosis    Discharge Instructions  Discharge Instructions    Discharge patient   Complete by: As directed    Discharge disposition: 06-Home-Health Care Svc   Discharge patient date: 11/04/2019     Allergies as of 11/04/2019      Reactions   Latex Itching   Morphine And Related Hives, Itching   Ibuprofen Hives, Itching, Rash      Medication List    TAKE these medications   albuterol 108 (90 Base) MCG/ACT inhaler Commonly known as: VENTOLIN HFA Inhale 2 puffs into the lungs every 4 (four) hours as needed for wheezing or cough.   azithromycin 250 MG tablet Commonly known as: ZITHROMAX Take 250 mg by mouth daily.   fluticasone 50 MCG/ACT nasal spray Commonly known as: FLONASE Place 2 sprays into both nostrils daily as needed for allergies or rhinitis.   gabapentin 300 MG capsule Commonly known as: NEURONTIN Take 300 mg by mouth 3 (three) times daily.   guaiFENesin-dextromethorphan 100-10 MG/5ML syrup Commonly known as: ROBITUSSIN DM Take 5 mLs by mouth every 4 (four) hours as needed for cough.   ipratropium-albuterol 0.5-2.5 (3) MG/3ML Soln Commonly known as: DUONEB Take 3 mLs by  nebulization every 6 (six) hours.   meloxicam 7.5 MG tablet Commonly known as: MOBIC Take 7.5 mg by mouth 2 (two) times daily as needed for pain.   predniSONE 50 MG tablet Commonly known as: DELTASONE Take 1 tablet (50 mg total) by mouth daily with breakfast for 4 days. What changed:   medication strength  See the new  instructions.   tiZANidine 4 MG tablet Commonly known as: ZANAFLEX Take 4 mg by mouth 3 (three) times daily as needed for muscle spasms.      Follow-up Information    [facility] Follow up in 3 day(s).   Specialty: Internal Medicine Contact information: [address] [place] [address] [phone]          Allergies  Allergen Reactions  . Latex Itching  . Morphine And Related Hives and Itching  . Ibuprofen Hives, Itching and Rash    Consultations: None   Procedures/Studies: DG Chest 2 View  Result Date: 10/27/2019 CLINICAL DATA:  43 year old female with a history of shortness of breath and cough for 5 days EXAM: CHEST - 2 VIEW COMPARISON:  10/25/2019, 10/18/2017 FINDINGS: Cardiomediastinal silhouette unchanged in size and contour. No interlobular septal thickening. No pneumothorax. No pleural effusion. Coarsened interstitial markings with low lung volumes compared to the prior. Overlying EKG leads. No acute displaced fracture. IMPRESSION: Low lung volumes with likely atelectasis, and no evidence of acute cardiopulmonary disease. Electronically Signed   By: Gilmer Mor D.O.   On: 10/27/2019 07:47   CT Angio Chest PE W/Cm &/Or Wo Cm  Result Date: 11/02/2019 CLINICAL DATA:  Shortness of breath EXAM: CT ANGIOGRAPHY CHEST WITH CONTRAST TECHNIQUE: Multidetector CT imaging of the chest was performed using the standard protocol during bolus administration of intravenous contrast. Multiplanar CT image reconstructions and MIPs were obtained to evaluate the vascular anatomy. CONTRAST:  OMNIPAQUE IOHEXOL 350 MG/ML SOLN COMPARISON:  None. FINDINGS: Cardiovascular: Examination is somewhat limited by marginal contrast bolus and motion artifact. Within this limitation, no evidence of pulmonary embolism through the segmental pulmonary arterial level. No evidence of pulmonary embolism. Normal heart size. No pericardial effusion. Mediastinum/Nodes: No enlarged mediastinal,  hilar, or axillary lymph nodes. Thyroid gland, trachea, and esophagus demonstrate no significant findings. Lungs/Pleura: Lungs are clear. No pleural effusion or pneumothorax. Upper Abdomen: No acute abnormality. Musculoskeletal: No chest wall abnormality. No acute or significant osseous findings. Review of the MIP images confirms the above findings. IMPRESSION: Examination is somewhat limited by marginal contrast bolus and motion artifact. Within this limitation, no evidence of pulmonary embolism through the segmental pulmonary arterial level. Electronically Signed   By: Lauralyn Primes M.D.   On: 11/02/2019 21:30   DG Chest Portable 1 View  Result Date: 11/02/2019 CLINICAL DATA:  Pt to ED for severe SOB, pt unable to communicate. Pt hx asthma. Pt tried inhaler 4 times with no relief. Pt is a former smoker EXAM: PORTABLE CHEST 1 VIEW COMPARISON:  10/27/2019. FINDINGS: Cardiac silhouette is normal in size. No mediastinal or hilar masses. No evidence of adenopathy. Clear lungs.  No pleural effusion or pneumothorax. Skeletal structures are grossly intact. IMPRESSION: No active disease. Electronically Signed   By: Amie Portland M.D.   On: 11/02/2019 17:12   DG Chest Port 1 View  Result Date: 10/25/2019 CLINICAL DATA:  Respiratory distress EXAM: PORTABLE CHEST 1 VIEW COMPARISON:  October 18, 2017 FINDINGS: The heart size and mediastinal contours are within normal limits. Both lungs are clear. The visualized skeletal structures are unremarkable. IMPRESSION:  No active disease. Electronically Signed   By: Gerome Samavid  Williams III M.D   On: 10/25/2019 01:55      Discharge Exam: Vitals:   11/04/19 0734 11/04/19 0827  BP: 106/73   Pulse: 99   Resp: 18   Temp: 98.4 F (36.9 C)   SpO2: 95% 96%   Vitals:   11/03/19 2300 11/04/19 0500 11/04/19 0734 11/04/19 0827  BP: 108/67 116/73 106/73   Pulse: 86 81 99   Resp: (!) 23 (!) 30 18   Temp: 98.3 F (36.8 C) 98.3 F (36.8 C) 98.4 F (36.9 C)   TempSrc: Oral  Oral Oral   SpO2: 95% 95% 95% 96%  Weight:  (!) 144.8 kg    Height:        General: Pt is alert, awake, not in acute distress Cardiovascular: RRR, S1/S2 +, no rubs, no gallops Respiratory: Decreased breath sounds due to body habitus, no wheezing, no rhonchi Abdominal: Soft, NT, ND, bowel sounds + Extremities: no edema, no cyanosis    The results of significant diagnostics from this hospitalization (including imaging, microbiology, ancillary and laboratory) are listed below for reference.     Microbiology: Recent Results (from the past 240 hour(s))  MRSA PCR Screening     Status: None   Collection Time: 11/03/19  7:56 AM   Specimen: Nasal Mucosa; Nasopharyngeal  Result Value Ref Range Status   MRSA by PCR NEGATIVE NEGATIVE Final    Comment:        The GeneXpert MRSA Assay (FDA approved for NASAL specimens only), is one component of a comprehensive MRSA colonization surveillance program. It is not intended to diagnose MRSA infection nor to guide or monitor treatment for MRSA infections. Performed at [facility], [address] [place], [place] [address]      Labs: BNP (last 3 results) Recent Labs    10/25/19 0142  BNP 36.3   Basic Metabolic Panel: Recent Labs  Lab 10/29/19 0233 11/02/19 1700 11/02/19 1713 11/03/19 0253  NA 137 137 136 138  K 4.0 4.5 4.3 4.3  CL 105 105  --  106  CO2 22 20*  --  21*  GLUCOSE 157* 157*  --  193*  BUN 14 <5*  --  5*  CREATININE 0.84 0.70  --  0.62  CALCIUM 8.7* 9.0  --  9.2   Liver Function Tests: Recent Labs  Lab 11/02/19 1700 11/03/19 0253  AST 19 16  ALT 28 25  ALKPHOS 100 98  BILITOT 0.6 0.6  PROT 7.3 6.9  ALBUMIN 3.6 3.2*   No results for input(s): LIPASE, AMYLASE in the last 168 hours. No results for input(s): AMMONIA in the last 168 hours. CBC: Recent Labs  Lab 10/29/19 0233 11/02/19 1700 11/02/19 1713 11/03/19 0253  WBC 16.9* 25.2*  --  23.3*  NEUTROABS  --  22.4*  --   --   HGB 13.6 14.2  15.0 13.5  HCT 42.3 44.6 44.0 40.8  MCV 72.6* 74.0*  --  71.7*  PLT 427* 408*  --  396   Cardiac Enzymes: No results for input(s): CKTOTAL, CKMB, CKMBINDEX, TROPONINI in the last 168 hours. BNP: Invalid input(s): POCBNP CBG: Recent Labs  Lab 11/03/19 1721 11/03/19 2104 11/04/19 0634  GLUCAP 205* 157* 133*   D-Dimer Recent Labs    11/02/19 1700  DDIMER 1.58*   Hgb A1c Recent Labs    11/03/19 0253  HGBA1C 6.2*   Lipid Profile No results for input(s): CHOL, HDL,  LDLCALC, TRIG, CHOLHDL, LDLDIRECT in the last 72 hours. Thyroid function studies No results for input(s): TSH, T4TOTAL, T3FREE, THYROIDAB in the last 72 hours.  Invalid input(s): FREET3 Anemia work up No results for input(s): VITAMINB12, FOLATE, FERRITIN, TIBC, IRON, RETICCTPCT in the last 72 hours. Urinalysis    Component Value Date/Time   COLORURINE YELLOW 11/02/2019 1747   APPEARANCEUR CLEAR 11/02/2019 1747   LABSPEC 1.008 11/02/2019 1747   PHURINE 6.0 11/02/2019 1747   GLUCOSEU NEGATIVE 11/02/2019 1747   HGBUR MODERATE (A) 11/02/2019 1747   BILIRUBINUR NEGATIVE 11/02/2019 1747   KETONESUR 5 (A) 11/02/2019 1747   PROTEINUR NEGATIVE 11/02/2019 1747   NITRITE POSITIVE (A) 11/02/2019 1747   LEUKOCYTESUR NEGATIVE 11/02/2019 1747   Sepsis Labs Invalid input(s): PROCALCITONIN,  WBC,  LACTICIDVEN Microbiology Recent Results (from the past 240 hour(s))  MRSA PCR Screening     Status: None   Collection Time: 11/03/19  7:56 AM   Specimen: Nasal Mucosa; Nasopharyngeal  Result Value Ref Range Status   MRSA by PCR NEGATIVE NEGATIVE Final    Comment:        The GeneXpert MRSA Assay (FDA approved for NASAL specimens only), is one component of a comprehensive MRSA colonization surveillance program. It is not intended to diagnose MRSA infection nor to guide or monitor treatment for MRSA infections. Performed at [facility], [place] [address], [place], [place] [address]      Time coordinating  discharge: Over 30 minutes  SIGNED:   Darliss Cheney, MD  [facility] 11/04/2019, 9:00 AM  If 7PM-7AM, please contact night-coverage [email or web address] Password [email or web address]

## 2019-11-04 NOTE — TOC Transition Note (Addendum)
Transition of Care [facility]) - CM/SW Discharge Note   Patient Details  Name: Kiara Leach MRN: [id number] Date of Birth: 1976-07-21  [facility] CM/SW Contact:  Zenon Mayo, RN Phone Number: 11/04/2019, 10:13 AM   Clinical Narrative:    Patient is for dc today, NCM spoke with patient , offered choice for HHPT, she states she does not have a preference. NCM made referral to [facility].  They are able to take referral for HHPT/HHOT.  Soc will begin on Friday  .  She also states she needs a bariatric 3 n 1 and she is ok with [employer] doing this for her, referral made to [facility] with [facility], it will be brought to her room prior to dc. Patient medication cost is 3.00, she states she can afford it.   Final next level of care: [place] Barriers to Discharge: No Barriers Identified   Patient Goals and CMS Choice Patient states their goals for this hospitalization and ongoing recovery are:: get well CMS Medicare.gov Compare Post Acute Care list provided to:: Patient Choice offered to / list presented to : Patient  Discharge Placement                       Discharge Plan and Services                DME Arranged: 3-N-1 DME Agency: [facility] Date DME Agency Contacted: 11/04/19 Time DME Agency Contacted: [phone] Representative spoke with at DME Agency: zack HH Arranged: PT/OT [place]: [place] Date [place]: 11/04/19 Time [place]: 64 Representative spoke with at [place]: cory  Social Determinants of Health ([place]) Interventions     Readmission Risk Interventions No flowsheet data found.

## 2019-11-04 NOTE — Progress Notes (Signed)
Physical Therapy Treatment Patient Details Name: Kiara Leach MRN: [id number] DOB: 02-18-1976 Today's Date: 11/04/2019    History of Present Illness Patient is a 43 y/o female who presents with dyspnea. Recent admission for asthma exacerbation 12/5-12/9. Admitted with acute respiratory failure secondary to asthma exacerbation. PMH includes asthma, back pain and obesity.    PT Comments    Pt limited by coughing during session. Pt consistently coughing with all mobility, with some intermittent relief from ice water and with rest after RN provides cough medicine. Pt tachy up to 135 during coughing spells. Pt activity tolerance limited at baseline 2/2 hip pain as well as breathing. Pt is a very limited ambulator at home, only walking short distances, max of 10' per pt report. Pt continues to demonstrate cardiopulmonary deficits which limit activity tolerance and increase caregiver burden. Pt will continue to benefit from acute PT POC to improve activity tolerance and quality of life.   Follow Up Recommendations  Home health PT;Supervision/Assistance - 24 hour     Equipment Recommendations  3in1 (PT)    Recommendations for Other Services       Precautions / Restrictions Precautions Precautions: Fall Precaution Comments: watch HR/RR Restrictions Weight Bearing Restrictions: No    Mobility  Bed Mobility Overal bed mobility: Needs Assistance Bed Mobility: Supine to Sit     Supine to sit: Min assist;HOB elevated        Transfers Overall transfer level: Needs assistance Equipment used: Rolling walker (2 wheeled) Transfers: Sit to/from Stand Sit to Stand: Min guard            Ambulation/Gait Ambulation/Gait assistance: Min guard Gait Distance (Feet): 3 Feet Assistive device: Rolling walker (2 wheeled) Gait Pattern/deviations: Trunk flexed;Wide base of support;Shuffle Gait velocity: reduced Gait velocity interpretation: <1.31 ft/sec, indicative of household  ambulator General Gait Details: pt with short shuffling steps, bent over walker, from bed to recliner   Stairs             Wheelchair Mobility    Modified Rankin (Stroke Patients Only)       Balance Overall balance assessment: Needs assistance Sitting-balance support: No upper extremity supported;Feet supported Sitting balance-Leahy Scale: Good Sitting balance - Comments: supervision   Standing balance support: Bilateral upper extremity supported Standing balance-Leahy Scale: Fair Standing balance comment: minG with BUE support of RW                            Cognition Arousal/Alertness: Awake/alert Behavior During Therapy: WFL for tasks assessed/performed Overall Cognitive Status: Within Functional Limits for tasks assessed                                        Exercises      General Comments General comments (skin integrity, edema, etc.): pt coughing heavily during treatment session, HR elevated to 135 during coughing spells, SpO2 ranging from 92-95% while on room air. Pt activity tolerance limited by coughing, pt reports she feels as if she is choking. RN provides cough medicine during session and pt with some improvement in symptoms and vitals with seated rest in recliner. RT present aqt completion of session to provide breathing treatment      Pertinent Vitals/Pain Pain Assessment: Faces Faces Pain Scale: Hurts little more Pain Location: Bilateral hips Pain Descriptors / Indicators: Sore;Grimacing;Guarding Pain Intervention(s): Limited activity within patient's tolerance  Home Living                      Prior Function            PT Goals (current goals can now be found in the care plan section) Acute Rehab PT Goals Patient Stated Goal: to get better Progress towards PT goals: Not progressing toward goals - comment(limited due to coughing)    Frequency    Min 3X/week      PT Plan Current plan remains  appropriate    Co-evaluation              AM-PAC PT "6 Clicks" Mobility   Outcome Measure  Help needed turning from your back to your side while in a flat bed without using bedrails?: A Little Help needed moving from lying on your back to sitting on the side of a flat bed without using bedrails?: A Little Help needed moving to and from a bed to a chair (including a wheelchair)?: A Little Help needed standing up from a chair using your arms (e.g., wheelchair or bedside chair)?: A Little Help needed to walk in hospital room?: A Little Help needed climbing 3-5 steps with a railing? : A Lot 6 Click Score: 17    End of Session Equipment Utilized During Treatment: (none) Activity Tolerance: Treatment limited secondary to medical complications (Comment) Patient left: in chair;with call bell/phone within reach Nurse Communication: Mobility status PT Visit Diagnosis: Pain;Muscle weakness (generalized) (M62.81);Difficulty in walking, not elsewhere classified (R26.2);Other abnormalities of gait and mobility (R26.89) Pain - Right/Left: Left Pain - part of body: Hip     Time: 0806-0829 PT Time Calculation (min) (ACUTE ONLY): 23 min  Charges:  $Therapeutic Activity: 23-37 mins                     Arlyss Gandy, PT, DPT Acute Rehabilitation Pager: [phone]    Arlyss Gandy 11/04/2019, 8:36 AM

## 2020-04-02 ENCOUNTER — Other Ambulatory Visit: Payer: Self-pay | Admitting: Internal Medicine

## 2020-04-02 DIAGNOSIS — Z1231 Encounter for screening mammogram for malignant neoplasm of breast: Secondary | ICD-10-CM

## 2020-06-02 ENCOUNTER — Emergency Department (HOSPITAL_COMMUNITY): Payer: Medicaid Other

## 2020-06-02 ENCOUNTER — Other Ambulatory Visit: Payer: Self-pay

## 2020-06-02 ENCOUNTER — Emergency Department (HOSPITAL_COMMUNITY)
Admission: EM | Admit: 2020-06-02 | Discharge: 2020-06-02 | Disposition: A | Discharge status: Home / Self Care | Payer: Medicaid Other | Attending: Emergency Medicine | Admitting: Emergency Medicine

## 2020-06-02 ENCOUNTER — Encounter (HOSPITAL_COMMUNITY): Payer: Self-pay

## 2020-06-02 DIAGNOSIS — M25551 Pain in right hip: Secondary | ICD-10-CM | POA: Diagnosis not present

## 2020-06-02 DIAGNOSIS — N3000 Acute cystitis without hematuria: Secondary | ICD-10-CM

## 2020-06-02 DIAGNOSIS — Z91040 Latex allergy status: Secondary | ICD-10-CM | POA: Diagnosis not present

## 2020-06-02 DIAGNOSIS — R531 Weakness: Secondary | ICD-10-CM | POA: Diagnosis not present

## 2020-06-02 DIAGNOSIS — M25552 Pain in left hip: Secondary | ICD-10-CM | POA: Insufficient documentation

## 2020-06-02 DIAGNOSIS — Z87891 Personal history of nicotine dependence: Secondary | ICD-10-CM | POA: Insufficient documentation

## 2020-06-02 DIAGNOSIS — G8929 Other chronic pain: Secondary | ICD-10-CM | POA: Insufficient documentation

## 2020-06-02 DIAGNOSIS — R079 Chest pain, unspecified: Secondary | ICD-10-CM | POA: Insufficient documentation

## 2020-06-02 DIAGNOSIS — N3090 Cystitis, unspecified without hematuria: Secondary | ICD-10-CM | POA: Insufficient documentation

## 2020-06-02 DIAGNOSIS — R05 Cough: Secondary | ICD-10-CM | POA: Diagnosis not present

## 2020-06-02 DIAGNOSIS — E669 Obesity, unspecified: Secondary | ICD-10-CM | POA: Diagnosis not present

## 2020-06-02 DIAGNOSIS — R002 Palpitations: Secondary | ICD-10-CM | POA: Diagnosis not present

## 2020-06-02 DIAGNOSIS — D72829 Elevated white blood cell count, unspecified: Secondary | ICD-10-CM | POA: Insufficient documentation

## 2020-06-02 DIAGNOSIS — Z20822 Contact with and (suspected) exposure to covid-19: Secondary | ICD-10-CM | POA: Insufficient documentation

## 2020-06-02 DIAGNOSIS — R112 Nausea with vomiting, unspecified: Secondary | ICD-10-CM | POA: Diagnosis present

## 2020-06-02 LAB — URINALYSIS, ROUTINE W REFLEX MICROSCOPIC
Bilirubin Urine: NEGATIVE
Glucose, UA: NEGATIVE mg/dL
Ketones, ur: 5 mg/dL — AB
Nitrite: POSITIVE — AB
Protein, ur: NEGATIVE mg/dL
Specific Gravity, Urine: 1.019 (ref 1.005–1.030)
pH: 5 (ref 5.0–8.0)

## 2020-06-02 LAB — CBC WITH DIFFERENTIAL/PLATELET
Abs Immature Granulocytes: 0.08 10*3/uL — ABNORMAL HIGH (ref 0.00–0.07)
Basophils Absolute: 0.2 10*3/uL — ABNORMAL HIGH (ref 0.0–0.1)
Basophils Relative: 1 %
Eosinophils Absolute: 1.2 10*3/uL — ABNORMAL HIGH (ref 0.0–0.5)
Eosinophils Relative: 7 %
HCT: 42.6 % (ref 36.0–46.0)
Hemoglobin: 14 g/dL (ref 12.0–15.0)
Immature Granulocytes: 0 %
Lymphocytes Relative: 19 %
Lymphs Abs: 3.4 10*3/uL (ref 0.7–4.0)
MCH: 24.2 pg — ABNORMAL LOW (ref 26.0–34.0)
MCHC: 32.9 g/dL (ref 30.0–36.0)
MCV: 73.7 fL — ABNORMAL LOW (ref 80.0–100.0)
Monocytes Absolute: 1.5 10*3/uL — ABNORMAL HIGH (ref 0.1–1.0)
Monocytes Relative: 8 %
Neutro Abs: 11.7 10*3/uL — ABNORMAL HIGH (ref 1.7–7.7)
Neutrophils Relative %: 65 %
Platelets: 397 10*3/uL (ref 150–400)
RBC: 5.78 MIL/uL — ABNORMAL HIGH (ref 3.87–5.11)
RDW: 17.4 % — ABNORMAL HIGH (ref 11.5–15.5)
WBC: 17.9 10*3/uL — ABNORMAL HIGH (ref 4.0–10.5)
nRBC: 0 % (ref 0.0–0.2)

## 2020-06-02 LAB — COMPREHENSIVE METABOLIC PANEL
ALT: 28 U/L (ref 0–44)
AST: 24 U/L (ref 15–41)
Albumin: 3.7 g/dL (ref 3.5–5.0)
Alkaline Phosphatase: 104 U/L (ref 38–126)
Anion gap: 11 (ref 5–15)
BUN: 13 mg/dL (ref 6–20)
CO2: 23 mmol/L (ref 22–32)
Calcium: 9.5 mg/dL (ref 8.9–10.3)
Chloride: 106 mmol/L (ref 98–111)
Creatinine, Ser: 0.77 mg/dL (ref 0.44–1.00)
GFR calc Af Amer: >60 mL/min (ref >60)
GFR calc non Af Amer: >60 mL/min (ref >60)
Glucose, Bld: 91 mg/dL (ref 70–99)
Potassium: 4 mmol/L (ref 3.5–5.1)
Sodium: 140 mmol/L (ref 135–145)
Total Bilirubin: 0.7 mg/dL (ref 0.3–1.2)
Total Protein: 7.7 g/dL (ref 6.5–8.1)

## 2020-06-02 LAB — CBG MONITORING, ED: Glucose-Capillary: 105 mg/dL — ABNORMAL HIGH (ref 70–99)

## 2020-06-02 LAB — D-DIMER, QUANTITATIVE: D-Dimer, Quant: 1.7 ug/mL-FEU — ABNORMAL HIGH (ref 0.00–0.50)

## 2020-06-02 LAB — SARS CORONAVIRUS 2 BY RT PCR (HOSPITAL ORDER, PERFORMED IN ~~LOC~~ HOSPITAL LAB): SARS Coronavirus 2: NEGATIVE

## 2020-06-02 LAB — LIPASE, BLOOD: Lipase: 19 U/L (ref 11–51)

## 2020-06-02 MED ORDER — OXYCODONE-ACETAMINOPHEN 5-325 MG PO TABS
2.0000 | ORAL_TABLET | Freq: Once | ORAL | Status: AC
  Administered 2020-06-02: 2 via ORAL
  Filled 2020-06-02: qty 2

## 2020-06-02 MED ORDER — CEPHALEXIN 500 MG PO CAPS: 500.0000 mg | ORAL_CAPSULE | Freq: Three times a day (TID) | ORAL | 0 refills | Status: AC

## 2020-06-02 MED ORDER — IOHEXOL 350 MG/ML SOLN
100.0000 mL | Freq: Once | INTRAVENOUS | Status: AC | PRN
  Administered 2020-06-02: 100 mL via INTRAVENOUS

## 2020-06-02 MED ORDER — ONDANSETRON HCL 4 MG/2ML IJ SOLN
4.0000 mg | Freq: Once | INTRAMUSCULAR | Status: DC
  Filled 2020-06-02: qty 2

## 2020-06-02 MED ORDER — ACETAMINOPHEN 325 MG PO TABS
650.0000 mg | ORAL_TABLET | Freq: Once | ORAL | Status: AC
  Administered 2020-06-02: 650 mg via ORAL
  Filled 2020-06-02: qty 2

## 2020-06-02 MED ORDER — CEPHALEXIN 500 MG PO CAPS
500.0000 mg | ORAL_CAPSULE | Freq: Once | ORAL | Status: AC
  Administered 2020-06-02: 500 mg via ORAL
  Filled 2020-06-02: qty 1

## 2020-06-02 MED ORDER — SODIUM CHLORIDE (PF) 0.9 % IJ SOLN
INTRAMUSCULAR | Status: AC
  Filled 2020-06-02: qty 50

## 2020-06-02 NOTE — Discharge Instructions (Signed)
You were seen today for nausea, vomiting.  It appears that you may have a urinary tract infection.  Your other work-up is reassuring.  You complained of ongoing chronic hip and knee pain.  Take Tylenol or Celebrex for pain as previously prescribed.  Follow-up with your primary physician regarding ongoing pain management needs.  You may benefit from weight loss.

## 2020-06-02 NOTE — ED Provider Notes (Signed)
[facility]-EMERGENCY DEPT Provider Note   CSN: [id number] Arrival date & time: 06/02/20  0007     History Chief Complaint  Patient presents with  . Weakness  . Nausea    Kiara Leach is a 45 y.o. female.  HPI     This a 44 year old female with a history of asthma, prediabetes who presents with nausea, vomiting, generalized weakness.  Patient reports over the last several months she has had worsening deconditioning and debilitation.  She reports that she is mostly bedbound because of chronic bilateral hip and knee pain that prohibits her from ambulating comfortably.  Her last 24 hours she has had nausea and vomiting.  She reports that she noted a streak of blood in her vomitous.  she reports some chest discomfort and palpitations as well.  She states that at times she feels like her heart is racing.  She is not had any recent fevers or cough.  She describes the emesis is nonbilious and nonbloody.  No abdominal pain.  Past Medical History:  Diagnosis Date  . Asthma   . Back pain     Patient Active Problem List   Diagnosis Date Noted  . Prediabetes 11/03/2019  . Leukocytosis 11/03/2019  . Acute respiratory failure with hypoxia (HCC) 10/25/2019  . Asthma exacerbation 10/25/2019  . Severe persistent asthma with status asthmaticus   . Ectopic pregnancy, tubal 12/21/2016  . Chest congestion   . CAP (community acquired pneumonia) 03/16/2016  . Sepsis (HCC) 03/16/2016  . Nausea with vomiting 03/16/2016  . Sinus tachycardia 03/16/2016    Past Surgical History:  Procedure Laterality Date  . CESAREAN SECTION    . CHOLECYSTECTOMY    . DIAGNOSTIC LAPAROSCOPY WITH REMOVAL OF ECTOPIC PREGNANCY N/A 12/21/2016   Procedure: DIAGNOSTIC LAPAROSCOPY WITH RIGHT SALPINGECTOMY;  Surgeon: Tilda Burrow, MD;  Location: [facility];  Service: Gynecology;  Laterality: N/A;  . LAPAROSCOPIC LYSIS OF ADHESIONS  12/21/2016   Procedure: LAPAROSCOPIC LYSIS OF ADHESIONS;  Surgeon: Tilda Burrow, MD;  Location: [facility];  Service: Gynecology;;     OB History    Gravida  7   Para  6   Term  6   Preterm      AB      Living  6     SAB      TAB      Ectopic      Multiple      Live Births              Family History  Problem Relation Age of Onset  . Asthma Brother     Social History   Tobacco Use  . Smoking status: Former Games developer  . Smokeless tobacco: Never Used  Substance Use Topics  . Alcohol use: No    Alcohol/week: 0.0 standard drinks  . Drug use: No    Home Medications Prior to Admission medications   Medication Sig Start Date End Date Taking? Authorizing Provider  acetaminophen (TYLENOL) 500 MG tablet Take 1,000 mg by mouth every 6 (six) hours as needed for mild pain.   Yes [provider]  albuterol (VENTOLIN HFA) 108 (90 Base) MCG/ACT inhaler Inhale 2 puffs into the lungs every 4 (four) hours as needed for wheezing or cough. 09/19/19  Yes [provider]  butalbital-acetaminophen-caffeine (FIORICET) 50-325-40 MG tablet Take 1 tablet by mouth every 6 (six) hours as needed for headache.  05/05/20  Yes [provider]  celecoxib (CELEBREX) 200 MG capsule Take  200 mg by mouth 2 (two) times daily. 05/12/20  Yes [provider]  cetirizine (ZYRTEC) 10 MG tablet Take 10 mg by mouth daily.  05/04/20  Yes [provider]  DULERA 200-5 MCG/ACT AERO Inhale 1 puff into the lungs every 12 (twelve) hours. 05/04/20  Yes [provider]  NYSTATIN powder Apply 1 application topically 2 (two) times daily as needed (rash).  05/12/20  Yes [provider]  phentermine (ADIPEX-P) 37.5 MG tablet Take 37.5 mg by mouth every morning. 05/04/20  Yes [provider]  tiZANidine (ZANAFLEX) 4 MG tablet Take 4 mg by mouth 3 (three) times daily as needed for muscle spasms. 10/22/19  Yes [provider]  Vitamin D, Ergocalciferol, (DRISDOL) 1.25 MG (50000 UNIT) CAPS capsule Take 1 capsule by mouth  once a week. 05/11/20  Yes [provider]  cephALEXin (KEFLEX) 500 MG capsule Take 1 capsule (500 mg total) by mouth 3 (three) times daily. 06/02/20   Ladon Vandenberghe, Mayer Masker, MD  guaiFENesin-dextromethorphan (ROBITUSSIN DM) 100-10 MG/5ML syrup Take 5 mLs by mouth every 4 (four) hours as needed for cough. Patient not taking: Reported on 11/02/2019 10/28/19   Azucena Fallen, MD  ipratropium-albuterol (DUONEB) 0.5-2.5 (3) MG/3ML SOLN Take 3 mLs by nebulization every 6 (six) hours. Patient not taking: Reported on 06/02/2020 10/28/19   Azucena Fallen, MD    Allergies    Latex, Morphine and related, and Ibuprofen  Review of Systems   Review of Systems  Constitutional: Negative for fever.  Cardiovascular: Positive for chest pain and palpitations.  Gastrointestinal: Positive for nausea and vomiting. Negative for abdominal pain.  Genitourinary: Negative for dysuria.  Musculoskeletal:       Bilateral hip and knee pain  Neurological: Positive for weakness.  All other systems reviewed and are negative.   Physical Exam Updated Vital Signs BP 135/81   Pulse 88   Temp 98.1 F (36.7 C) (Oral)   Resp 18   SpO2 97%   Physical Exam Vitals and nursing note reviewed.  Constitutional:      Appearance: She is well-developed. She is obese.     Comments: Morbidly obese  HENT:     Head: Normocephalic and atraumatic.     Mouth/Throat:     Mouth: Mucous membranes are dry.  Eyes:     Pupils: Pupils are equal, round, and reactive to light.  Cardiovascular:     Rate and Rhythm: Normal rate and regular rhythm.     Heart sounds: Normal heart sounds.     Comments: Severely limited by body habitus Pulmonary:     Effort: Pulmonary effort is normal. No respiratory distress.     Breath sounds: No wheezing.  Abdominal:     General: Bowel sounds are normal.     Palpations: Abdomen is soft.     Tenderness: There is no abdominal tenderness. There is no guarding or rebound.  Musculoskeletal:         General: No tenderness.     Cervical back: Neck supple.     Right lower leg: No edema.     Left lower leg: No edema.  Skin:    General: Skin is warm and dry.  Neurological:     Mental Status: She is alert and oriented to person, place, and time.  Psychiatric:        Mood and Affect: Mood normal.     ED Results / Procedures / Treatments   Labs (all labs ordered are listed, but only abnormal  results are displayed) Labs Reviewed  CBC WITH DIFFERENTIAL/PLATELET - Abnormal; Notable for the following components:      Result Value   WBC 17.9 (*)    RBC 5.78 (*)    MCV 73.7 (*)    MCH 24.2 (*)    RDW 17.4 (*)    Neutro Abs 11.7 (*)    Monocytes Absolute 1.5 (*)    Eosinophils Absolute 1.2 (*)    Basophils Absolute 0.2 (*)    Abs Immature Granulocytes 0.08 (*)    All other components within normal limits  URINALYSIS, ROUTINE W REFLEX MICROSCOPIC - Abnormal; Notable for the following components:   APPearance HAZY (*)    Hgb urine dipstick MODERATE (*)    Ketones, ur 5 (*)    Nitrite POSITIVE (*)    Leukocytes,Ua TRACE (*)    Bacteria, UA MANY (*)    All other components within normal limits  D-DIMER, QUANTITATIVE (NOT [facility]) - Abnormal; Notable for the following components:   D-Dimer, Quant 1.70 (*)    All other components within normal limits  CBG MONITORING, ED - Abnormal; Notable for the following components:   Glucose-Capillary 105 (*)    All other components within normal limits  SARS CORONAVIRUS 2 BY RT PCR (HOSPITAL ORDER, PERFORMED IN [facility])  URINE CULTURE  COMPREHENSIVE METABOLIC PANEL  LIPASE, BLOOD    EKG EKG Interpretation  Date/Time:  Wednesday June 02 2020 00:31:10 EDT Ventricular Rate:  87 PR Interval:    QRS Duration: 105 QT Interval:  348 QTC Calculation: 419 R Axis:   25 Text Interpretation: Sinus rhythm Abnormal R-wave progression, early transition Nonspecific T abnrm, anterolateral leads Confirmed by Ross MarcusHorton, Malay Fantroy  [phone]) on 06/02/2020 2:57:45 AM   Radiology CT Angio Chest PE W and/or Wo Contrast  Result Date: 06/02/2020 CLINICAL DATA:  Palpitations and weakness for 3 months. Elevated D-dimer. EXAM: CT ANGIOGRAPHY CHEST WITH CONTRAST TECHNIQUE: Multidetector CT imaging of the chest was performed using the standard protocol during bolus administration of intravenous contrast. Multiplanar CT image reconstructions and MIPs were obtained to evaluate the vascular anatomy. CONTRAST:  100mL OMNIPAQUE IOHEXOL 350 MG/ML SOLN COMPARISON:  11/02/2019 FINDINGS: Cardiovascular: The heart is normal in size. No pericardial effusion. The aorta is normal in caliber. No dissection. No atherosclerotic calcifications. The branch vessels are patent. No coronary artery calcifications. The pulmonary arterial tree is fairly well opacified. No filling defects to suggest pulmonary embolism. Mediastinum/Nodes: No mediastinal or hilar mass or adenopathy. Lungs/Pleura: Moderate breathing motion artifact. The lungs are clear of an acute process. No pulmonary lesions or nodules. No pleural effusion. Upper Abdomen: No significant upper abdominal findings. Musculoskeletal: No breast masses, supraclavicular or axillary adenopathy. The thyroid gland is unremarkable. No significant bony findings. Review of the MIP images confirms the above findings. IMPRESSION: 1. No CT findings for pulmonary embolism. 2. Normal thoracic aorta. 3. No acute pulmonary findings. Electronically Signed   By: Rudie MeyerP.  Gallerani M.D.   On: 06/02/2020 05:06   DG Chest Portable 1 View  Result Date: 06/02/2020 CLINICAL DATA:  44 year old female with cough. EXAM: PORTABLE CHEST 1 VIEW COMPARISON:  Chest radiograph dated 11/02/2019. FINDINGS: No focal consolidation, pleural effusion, or pneumothorax. The right costophrenic angle has been excluded from the image. The cardiac silhouette is within limits. No acute osseous pathology. IMPRESSION: No active disease. Electronically Signed    By: Elgie CollardArash  Radparvar M.D.   On: 06/02/2020 02:02    Procedures Procedures (including critical care time)  Medications  Ordered in ED Medications  ondansetron (ZOFRAN) injection 4 mg (4 mg Intravenous Refused 06/02/20 0256)  sodium chloride (PF) 0.9 % injection (has no administration in time range)  acetaminophen (TYLENOL) tablet 650 mg (650 mg Oral Given 06/02/20 0326)  iohexol (OMNIPAQUE) 350 MG/ML injection 100 mL (100 mLs Intravenous Contrast Given 06/02/20 0440)  cephALEXin (KEFLEX) capsule 500 mg (500 mg Oral Given 06/02/20 0541)  oxyCODONE-acetaminophen (PERCOCET/ROXICET) 5-325 MG per tablet 2 tablet (2 tablets Oral Given 06/02/20 9622)    ED Course  I have reviewed the triage vital signs and the nursing notes.  Pertinent labs & imaging results that were available during my care of the patient were reviewed by me and considered in my medical decision making (see chart for details).    MDM Rules/Calculators/A&P                           Patient presents with nausea and vomiting.  Has multiple other complaints and has been bedbound and physically deconditioned for some time.  She is morbidly obese but nontoxic appearing and in no acute distress.  Broad differential to include acute infectious process, viral etiology, ACS, PE.  She also has chronic pain which is unchanged from prior.  She reports that she has pain management appointment on the 15th.  Work-up initiated.  She has a leukocytosis to 17.9 but this is actually down trended from prior.  No significant metabolic derangements.  D-dimer was positive at 1.7.  Will obtain CT to rule out stroke.  Urinalysis is nitrite positive with 0-5 white cells and many bacteria.  We will culture.  Given leukocytosis and systemic symptoms of vomiting, will elect to treat.  CT of the chest does not show any evidence of acute pulmonary embolism.  EKG shows no evidence of acute ischemia or arrhythmia.  Suspect patient's systemic symptoms may be related to  UTI.  I discussed this with patient.  She continues to complain of bilateral hip and knee pain.  I discussed with her that she will need to follow-up with her primary doctor and pain specialist for ongoing pain issues.  I also discussed with her that she would benefit extremely from weight loss.  After history, exam, and medical workup I feel the patient has been appropriately medically screened and is safe for discharge home. Pertinent diagnoses were discussed with the patient. Patient was given return precautions.   Final Clinical Impression(s) / ED Diagnoses Final diagnoses:  Chronic pain of both hips  Acute cystitis without hematuria  Generalized weakness    Rx / DC Orders ED Discharge Orders         Ordered    cephALEXin (KEFLEX) 500 MG capsule  3 times daily     Discontinue  Reprint     06/02/20 0617           Shon Baton, MD 06/02/20 [phone]

## 2020-06-02 NOTE — ED Triage Notes (Signed)
In by [facility] due to weakness for the past three months, has been having issue with her knees and ankles which has left her bed bound, today she began to have some N/V and decided to come in to be evaluate.

## 2020-06-02 NOTE — ED Triage Notes (Signed)
Initial Blood sugar was 66 with EMS given 25cc of D10, repeat cbg was 134, states non-Diabetic per patient

## 2020-06-04 LAB — URINE CULTURE: Culture: >=100000 — AB

## 2020-12-14 IMAGING — CT CT ANGIO CHEST
3 of 7 series · 17 of 36 positions shown · IV contrast (OMNIPAQUE 350)
Comparison: 11/02/2019

CLINICAL DATA: Palpitations and weakness for 3 months. Elevated
D-dimer.

EXAM:
CT ANGIOGRAPHY CHEST WITH CONTRAST
TECHNIQUE: Multidetector CT imaging of the chest was performed using the
standard protocol during bolus administration of intravenous
contrast. Multiplanar CT image reconstructions and MIPs were
obtained to evaluate the vascular anatomy.
CONTRAST:  100mL OMNIPAQUE IOHEXOL 350 MG/ML SOLN

[Series 6: thins · axial · 0.62mm/px · z∈[-156,+58]mm · 12 of 254 slices shown]
[im 20/254  lung]
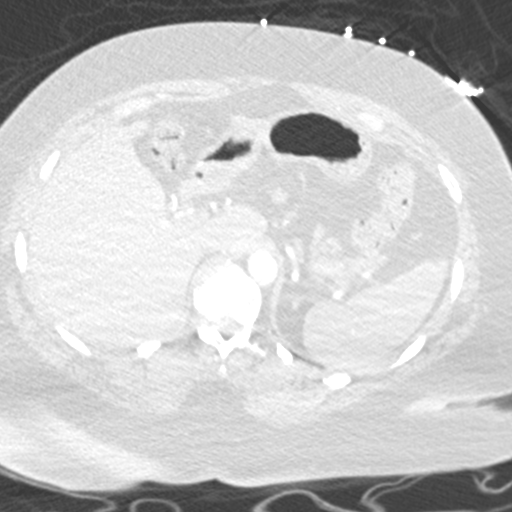
[im 39/254  mediastinal]
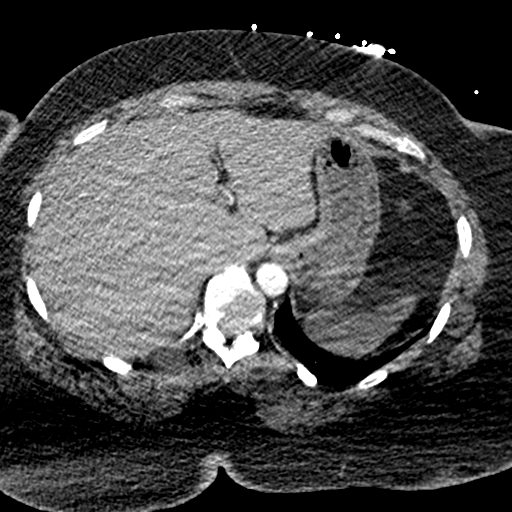
[im 59/254  lung]
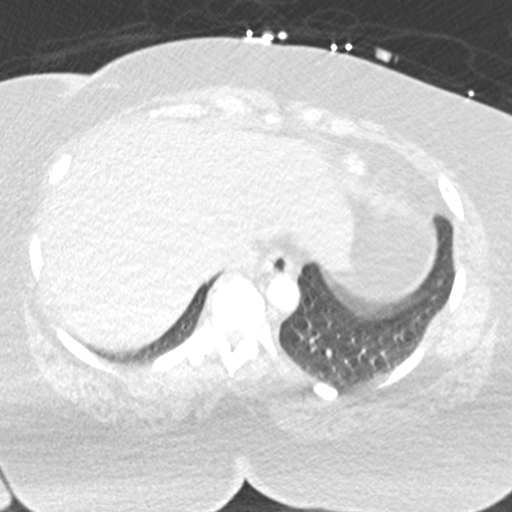
[im 78/254  mediastinal]
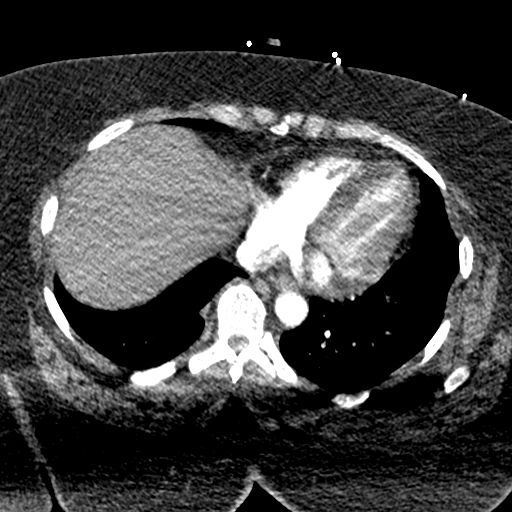
[im 98/254  lung]
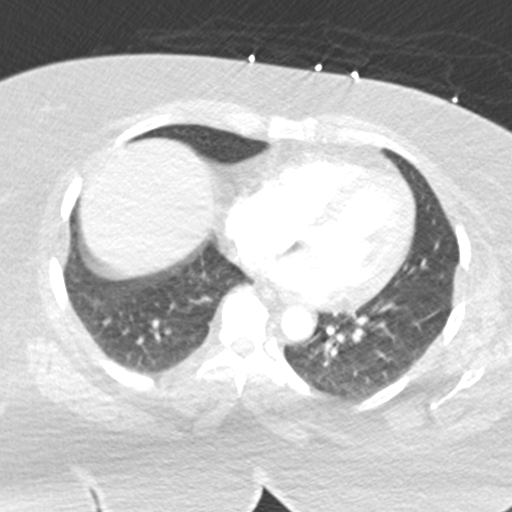
[im 117/254  mediastinal]
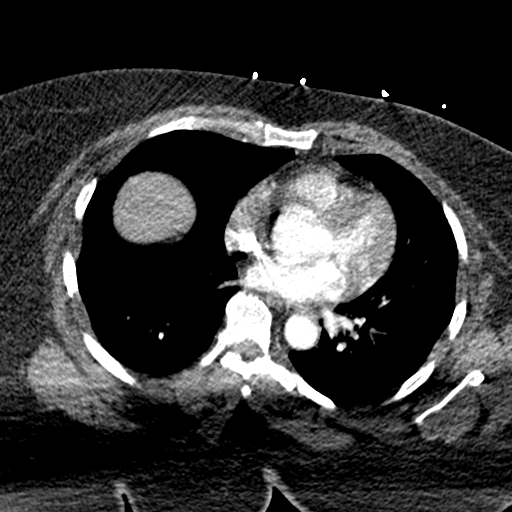
[im 137/254  lung]
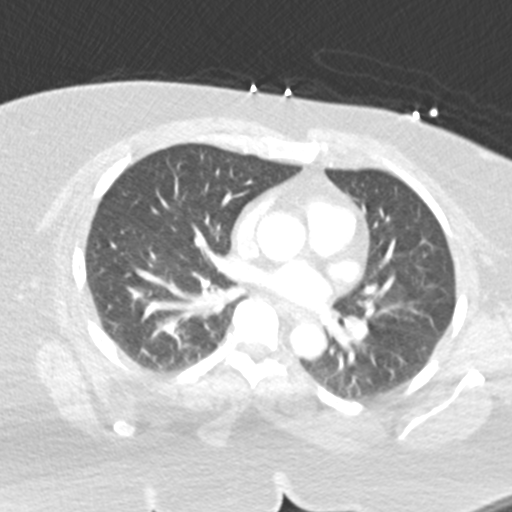
[im 156/254  mediastinal]
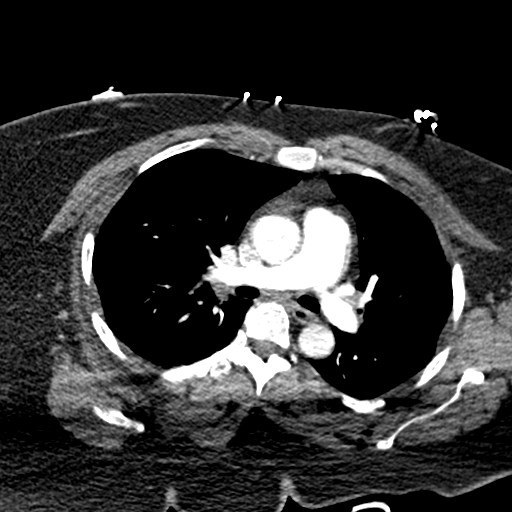
[im 176/254  lung]
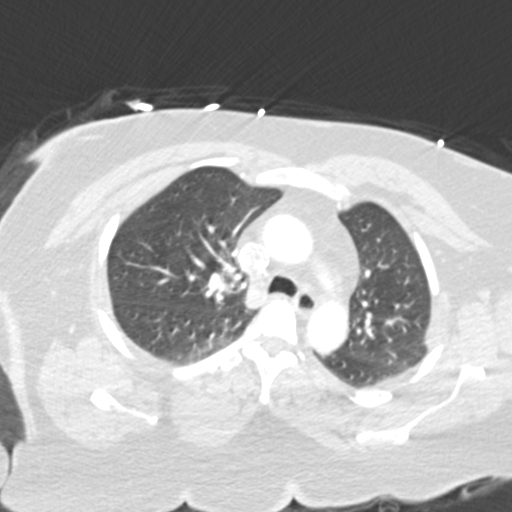
[im 195/254  mediastinal]
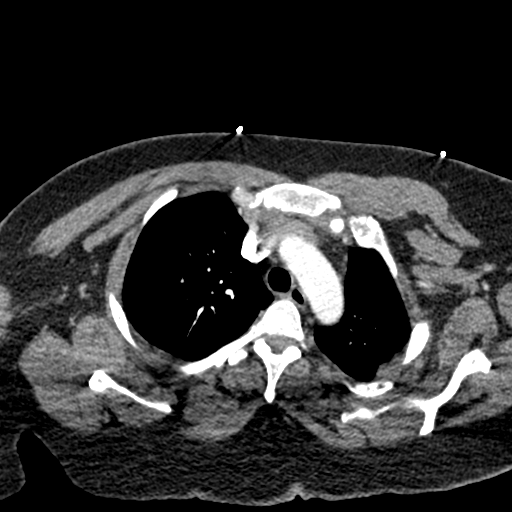
[im 215/254  lung]
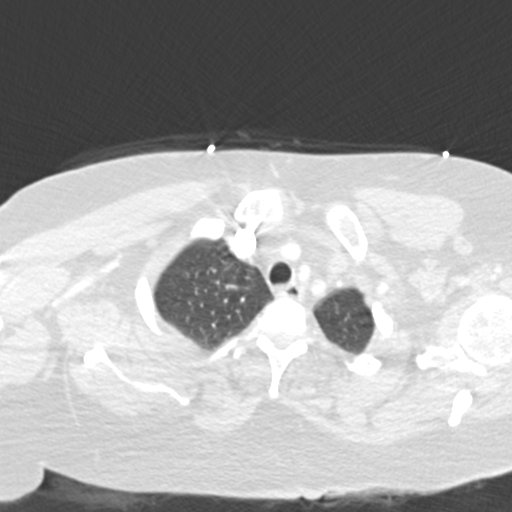
[im 234/254  mediastinal]
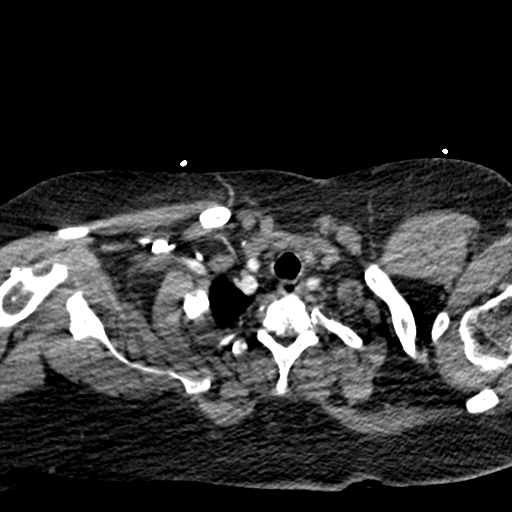

[Series 7: lung · axial · 0.62mm/px · z∈[-106,+32]mm · 4 of 115 slices shown]
[im 23/115  mediastinal]
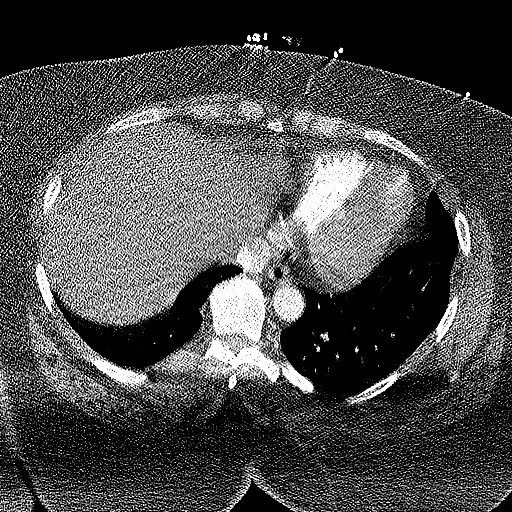
[im 46/115  mediastinal]
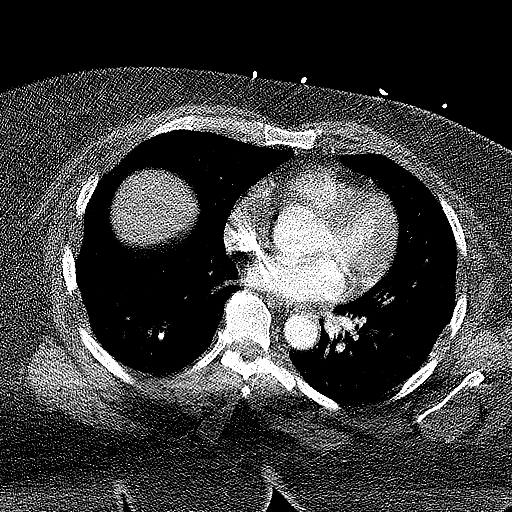
[im 69/115  mediastinal]
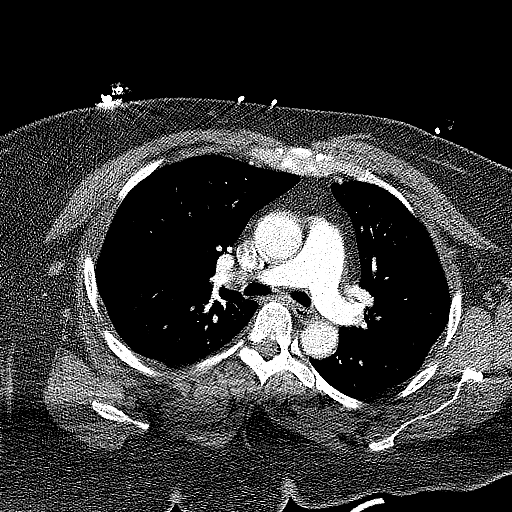
[im 92/115  mediastinal]
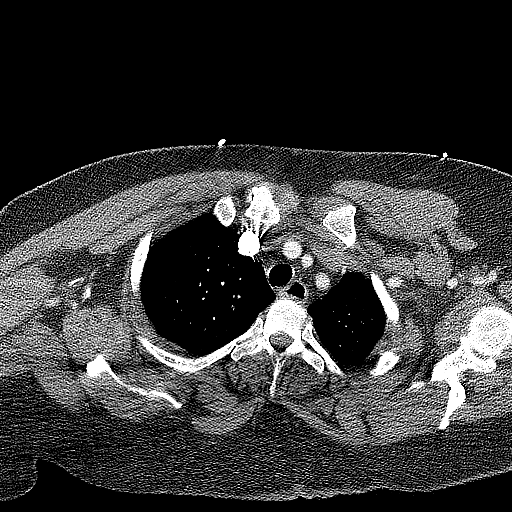

[Series 8: coronal mpr · coronal · 0.51mm/px · 1 of 132 slices shown]
[im 66/132  mediastinal]
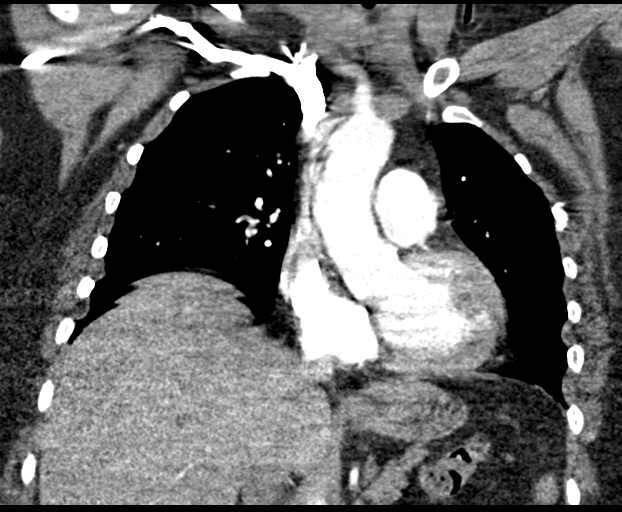

[17 of 36 positions shown; findings below may reference images not displayed]

FINDINGS: Cardiovascular: The heart is normal in size. No pericardial
effusion. The aorta is normal in caliber. No dissection. No
atherosclerotic calcifications. The branch vessels are patent. No
coronary artery calcifications.

The pulmonary arterial tree is fairly well opacified. No filling
defects to suggest pulmonary embolism.

Mediastinum/Nodes: No mediastinal or hilar mass or adenopathy.

Lungs/Pleura: Moderate breathing motion artifact. The lungs are
clear of an acute process. No pulmonary lesions or nodules. No
pleural effusion.

Upper Abdomen: No significant upper abdominal findings.

Musculoskeletal: No breast masses, supraclavicular or axillary
adenopathy. The thyroid gland is unremarkable.

No significant bony findings.

Review of the MIP images confirms the above findings.
IMPRESSION: 1. No CT findings for pulmonary embolism.
2. Normal thoracic aorta.
3. No acute pulmonary findings.

## 2022-10-30 ENCOUNTER — Other Ambulatory Visit: Payer: Self-pay | Admitting: Internal Medicine

## 2022-10-31 LAB — URINE CULTURE

## 2022-10-31 LAB — COMPLETE METABOLIC PANEL WITH GFR
AG Ratio: 1.1 (calc) (ref 1.0–2.5)
ALT: 15 U/L (ref 6–29)
AST: 18 U/L (ref 10–35)
Albumin: 4.1 g/dL (ref 3.6–5.1)
Alkaline phosphatase (APISO): 120 U/L (ref 31–125)
BUN: 9 mg/dL (ref 7–25)
CO2: 19 mmol/L — ABNORMAL LOW (ref 20–32)
Calcium: 9.5 mg/dL (ref 8.6–10.2)
Chloride: 106 mmol/L (ref 98–110)
Creat: 0.88 mg/dL (ref 0.50–0.99)
Globulin: 3.6 g/dL (calc) (ref 1.9–3.7)
Glucose, Bld: 87 mg/dL (ref 65–99)
Potassium: 5.1 mmol/L (ref 3.5–5.3)
Sodium: 139 mmol/L (ref 135–146)
Total Bilirubin: 0.6 mg/dL (ref 0.2–1.2)
Total Protein: 7.7 g/dL (ref 6.1–8.1)
eGFR: 82 mL/min/{1.73_m2} (ref >=60)

## 2022-10-31 LAB — LIPID PANEL
Cholesterol: 191 mg/dL (ref <200)
HDL: 56 mg/dL (ref >=50)
LDL Cholesterol (Calc): 115 mg/dL (calc) — ABNORMAL HIGH
Non-HDL Cholesterol (Calc): 135 mg/dL (calc) — ABNORMAL HIGH (ref <130)
Total CHOL/HDL Ratio: 3.4 (calc) (ref <5.0)
Triglycerides: 95 mg/dL (ref <150)

## 2022-10-31 LAB — CBC
HCT: 45.3 % — ABNORMAL HIGH (ref 35.0–45.0)
Hemoglobin: 15 g/dL (ref 11.7–15.5)
MCH: 25 pg — ABNORMAL LOW (ref 27.0–33.0)
MCHC: 33.1 g/dL (ref 32.0–36.0)
MCV: 75.6 fL — ABNORMAL LOW (ref 80.0–100.0)
MPV: 11.4 fL (ref 7.5–12.5)
Platelets: 356 10*3/uL (ref 140–400)
RBC: 5.99 10*6/uL — ABNORMAL HIGH (ref 3.80–5.10)
RDW: 15.8 % — ABNORMAL HIGH (ref 11.0–15.0)
WBC: 11.1 10*3/uL — ABNORMAL HIGH (ref 3.8–10.8)

## 2022-10-31 LAB — TSH: TSH: 1.18 mIU/L

## 2022-10-31 LAB — VITAMIN D 25 HYDROXY (VIT D DEFICIENCY, FRACTURES): Vit D, 25-Hydroxy: 10 ng/mL — ABNORMAL LOW (ref 30–100)

## 2022-12-19 ENCOUNTER — Ambulatory Visit (INDEPENDENT_AMBULATORY_CARE_PROVIDER_SITE_OTHER): Payer: Medicaid Other

## 2022-12-19 ENCOUNTER — Encounter: Payer: Self-pay | Admitting: Emergency Medicine

## 2022-12-19 ENCOUNTER — Ambulatory Visit
Admission: EM | Admit: 2022-12-19 | Discharge: 2022-12-19 | Disposition: A | Discharge status: Home / Self Care | Payer: Medicaid Other | Attending: Physician Assistant | Admitting: Physician Assistant

## 2022-12-19 DIAGNOSIS — J45901 Unspecified asthma with (acute) exacerbation: Secondary | ICD-10-CM | POA: Diagnosis not present

## 2022-12-19 DIAGNOSIS — J209 Acute bronchitis, unspecified: Secondary | ICD-10-CM

## 2022-12-19 DIAGNOSIS — R0602 Shortness of breath: Secondary | ICD-10-CM

## 2022-12-19 DIAGNOSIS — R051 Acute cough: Secondary | ICD-10-CM | POA: Diagnosis not present

## 2022-12-19 MED ORDER — ALBUTEROL SULFATE (2.5 MG/3ML) 0.083% IN NEBU: 2.5000 mg | INHALATION_SOLUTION | Freq: Four times a day (QID) | RESPIRATORY_TRACT | 1 refills | Status: AC | PRN

## 2022-12-19 MED ORDER — PROMETHAZINE-DM 6.25-15 MG/5ML PO SYRP: 5.0000 mL | ORAL_SOLUTION | Freq: Four times a day (QID) | ORAL | 0 refills | Status: AC | PRN

## 2022-12-19 MED ORDER — IPRATROPIUM-ALBUTEROL 0.5-2.5 (3) MG/3ML IN SOLN
3.0000 mL | Freq: Once | RESPIRATORY_TRACT | Status: AC
  Administered 2022-12-19: 3 mL via RESPIRATORY_TRACT

## 2022-12-19 MED ORDER — PREDNISONE 10 MG PO TABS: ORAL_TABLET | ORAL | 0 refills | Status: AC

## 2022-12-19 MED ORDER — DEXAMETHASONE SODIUM PHOSPHATE 10 MG/ML IJ SOLN
10.0000 mg | Freq: Once | INTRAMUSCULAR | Status: AC
  Administered 2022-12-19: 10 mg via INTRAMUSCULAR

## 2022-12-19 MED ORDER — DOXYCYCLINE HYCLATE 100 MG PO CAPS: 100.0000 mg | ORAL_CAPSULE | Freq: Two times a day (BID) | ORAL | 0 refills | Status: AC

## 2022-12-19 NOTE — ED Provider Notes (Signed)
[facility]    CSN: [id number] Arrival date & time: 12/19/22  1833      History   Chief Complaint Chief Complaint  Patient presents with   Shortness of Breath   Cough    HPI Kiara Leach is a 47 y.o. female with history of asthma.  Patient presents today for 22-month history of cough and congestion.  She says over the past 2 days she has had increased shortness of breath and wheezing.  Reports having a lot of difficulty catching her breath.  She says she has used her inhaler at home but it has not helped.  She reports having some pain in her back as well.  She denies any fever.  Not reporting any sinus congestion, sore throat.  No report of COVID or flu exposure.  Patient does have a history of asthma exacerbations and has had respiratory failure in the past.  Also history of pneumonia.  She denies any history of heart problems and has not had any leg swelling.  HPI  Past Medical History:  Diagnosis Date   Asthma    Back pain     Patient Active Problem List   Diagnosis Date Noted   Prediabetes 11/03/2019   Leukocytosis 11/03/2019   Acute respiratory failure with hypoxia (HCC) 10/25/2019   Asthma exacerbation 10/25/2019   Severe persistent asthma with status asthmaticus    Ectopic pregnancy, tubal 12/21/2016   Chest congestion    CAP (community acquired pneumonia) 03/16/2016   Sepsis ([place]) 03/16/2016   Nausea with vomiting 03/16/2016   Sinus tachycardia 03/16/2016    Past Surgical History:  Procedure Laterality Date   CESAREAN SECTION     CHOLECYSTECTOMY     DIAGNOSTIC LAPAROSCOPY WITH REMOVAL OF ECTOPIC PREGNANCY N/A 12/21/2016   Procedure: DIAGNOSTIC LAPAROSCOPY WITH RIGHT SALPINGECTOMY;  Surgeon: Jonnie Kind, MD;  Location: [facility];  Service: Gynecology;  Laterality: N/A;   LAPAROSCOPIC LYSIS OF ADHESIONS  12/21/2016   Procedure: LAPAROSCOPIC LYSIS OF ADHESIONS;  Surgeon: Jonnie Kind, MD;  Location: [facility];  Service: Gynecology;;    OB History      Gravida  7   Para  6   Term  6   Preterm      AB      Living  6      SAB      IAB      Ectopic      Multiple      Live Births               Home Medications    Prior to Admission medications   Medication Sig Start Date End Date Taking? Authorizing Provider  albuterol (PROVENTIL) (2.5 MG/3ML) 0.083% nebulizer solution Take 3 mLs (2.5 mg total) by nebulization every 6 (six) hours as needed for wheezing or shortness of breath. 12/19/22  Yes Laurene Footman B, PA-C  doxycycline (VIBRAMYCIN) 100 MG capsule Take 1 capsule (100 mg total) by mouth 2 (two) times daily for 7 days. 12/19/22 12/26/22 Yes Danton Clap, PA-C  predniSONE (DELTASONE) 10 MG tablet Take 6 tabs on day 1 and decrease by 1 tablet daily until complete 12/19/22  Yes Danton Clap, PA-C  promethazine-dextromethorphan (PROMETHAZINE-DM) 6.25-15 MG/5ML syrup Take 5 mLs by mouth 4 (four) times daily as needed. 12/19/22  Yes Danton Clap, PA-C  acetaminophen (TYLENOL) 500 MG tablet Take 1,000 mg by mouth every 6 (six) hours as needed for mild pain.    [provider]  albuterol (VENTOLIN HFA) 108 (90 Base) MCG/ACT inhaler Inhale 2 puffs into the lungs every 4 (four) hours as needed for wheezing or cough. 09/19/19   [provider]  butalbital-acetaminophen-caffeine (FIORICET) 50-325-40 MG tablet Take 1 tablet by mouth every 6 (six) hours as needed for headache.  05/05/20   [provider]  celecoxib (CELEBREX) 200 MG capsule Take 200 mg by mouth 2 (two) times daily. 05/12/20   [provider]  cephALEXin (KEFLEX) 500 MG capsule Take 1 capsule (500 mg total) by mouth 3 (three) times daily. 06/02/20   Horton, Barbette Hair, MD  cetirizine (ZYRTEC) 10 MG tablet Take 10 mg by mouth daily.  05/04/20   [provider]  DULERA 200-5 MCG/ACT AERO Inhale 1 puff into the lungs every 12 (twelve) hours. 05/04/20   [provider]  guaiFENesin-dextromethorphan (ROBITUSSIN DM) 100-10  MG/5ML syrup Take 5 mLs by mouth every 4 (four) hours as needed for cough. Patient not taking: Reported on 11/02/2019 10/28/19   Little Ishikawa, MD  ipratropium-albuterol (DUONEB) 0.5-2.5 (3) MG/3ML SOLN Take 3 mLs by nebulization every 6 (six) hours. Patient not taking: Reported on 06/02/2020 10/28/19   Little Ishikawa, MD  NYSTATIN powder Apply 1 application topically 2 (two) times daily as needed (rash).  05/12/20   [provider]  phentermine (ADIPEX-P) 37.5 MG tablet Take 37.5 mg by mouth every morning. 05/04/20   [provider]  tiZANidine (ZANAFLEX) 4 MG tablet Take 4 mg by mouth 3 (three) times daily as needed for muscle spasms. 10/22/19   [provider]  Vitamin D, Ergocalciferol, (DRISDOL) 1.25 MG (50000 UNIT) CAPS capsule Take 1 capsule by mouth once a week. 05/11/20   [provider]    Family History Family History  Problem Relation Age of Onset   Asthma Brother     Social History Social History   Tobacco Use   Smoking status: Former   Smokeless tobacco: Never  Scientific laboratory technician Use: Never used  Substance Use Topics   Alcohol use: No    Alcohol/week: 0.0 standard drinks of alcohol   Drug use: No     Allergies   Latex, Morphine and related, and Ibuprofen   Review of Systems Review of Systems  Constitutional:  Negative for chills, diaphoresis, fatigue and fever.  HENT:  Positive for congestion. Negative for ear pain, rhinorrhea, sinus pressure, sinus pain and sore throat.   Respiratory:  Positive for cough, shortness of breath and wheezing.   Cardiovascular:  Negative for chest pain.  Gastrointestinal:  Negative for abdominal pain, nausea and vomiting.  Musculoskeletal:  Positive for back pain. Negative for arthralgias and myalgias.  Skin:  Negative for rash.  Neurological:  Negative for weakness and headaches.  Hematological:  Negative for adenopathy.     Physical Exam Triage Vital Signs ED Triage Vitals  Enc  Vitals Group     BP      Pulse      Resp      Temp      Temp src      SpO2      Weight      Height      Head Circumference      Peak Flow      Pain Score      Pain Loc      Pain Edu?      Excl. in [place]?    No data found.  Updated Vital Signs BP (!) 149/90 (BP  Location: Left Wrist)   Pulse (!) 103   Temp 98.2 F (36.8 C)   Resp (!) 24   LMP 11/26/2022 (Approximate)   SpO2 100%      Physical Exam Vitals and nursing note reviewed.  Constitutional:      General: She is not in acute distress.    Appearance: Normal appearance. She is obese. She is ill-appearing. She is not toxic-appearing.  HENT:     Head: Normocephalic and atraumatic.     Nose: Nose normal.     Mouth/Throat:     Mouth: Mucous membranes are moist.     Pharynx: Oropharynx is clear.  Eyes:     General: No scleral icterus.       Right eye: No discharge.        Left eye: No discharge.     Conjunctiva/sclera: Conjunctivae normal.  Cardiovascular:     Rate and Rhythm: Normal rate and regular rhythm.     Heart sounds: Normal heart sounds.  Pulmonary:     Effort: Respiratory distress present.     Breath sounds: Examination of the right-upper field reveals wheezing. Examination of the left-upper field reveals wheezing. Examination of the right-middle field reveals wheezing. Examination of the left-middle field reveals wheezing. Examination of the right-lower field reveals wheezing. Examination of the left-lower field reveals wheezing. Wheezing present.  Musculoskeletal:     Cervical back: Neck supple.  Skin:    General: Skin is dry.  Neurological:     General: No focal deficit present.     Mental Status: She is alert. Mental status is at baseline.     Motor: No weakness.     Gait: Gait normal.  Psychiatric:        Mood and Affect: Mood normal.        Behavior: Behavior normal.        Thought Content: Thought content normal.      UC Treatments / Results  Labs (all labs ordered are listed, but only  abnormal results are displayed) Labs Reviewed - No data to display  EKG   Radiology DG Chest 2 View  Result Date: 12/19/2022 CLINICAL DATA:  Shortness of breath for 2 days. EXAM: CHEST - 2 VIEW COMPARISON:  AP chest 06/02/2020 FINDINGS: Cardiac silhouette and mediastinal contours are within normal limits. The lungs are clear. No pleural effusion or pneumothorax. No acute skeletal abnormality. IMPRESSION: No active cardiopulmonary disease. Electronically Signed   By: Neita Garnet M.D.   On: 12/19/2022 19:08    Procedures Procedures (including critical care time)  Medications Ordered in UC Medications  ipratropium-albuterol (DUONEB) 0.5-2.5 (3) MG/3ML nebulizer solution 3 mL (3 mLs Nebulization Given 12/19/22 1848)  dexamethasone (DECADRON) injection 10 mg (10 mg Intramuscular Given 12/19/22 1916)  ipratropium-albuterol (DUONEB) 0.5-2.5 (3) MG/3ML nebulizer solution 3 mL (3 mLs Nebulization Given 12/19/22 1916)    Initial Impression / Assessment and Plan / UC Course  I have reviewed the triage vital signs and the nursing notes.  Pertinent labs & imaging results that were available during my care of the patient were reviewed by me and considered in my medical decision making (see chart for details).   47 year old female with history of asthma presents for shortness of breath for the past couple of days.  She reports having cough and congestion for the past 2 months.  No fever.  Has used albuterol at home without relief.  She is presently afebrile.  She is ill-appearing but nontoxic.  She does have increased  respiratory rate and is having difficulty speaking in full sentences.  She speaks a couple words at a time and takes a deep breath.  Oxygen is 99 to 100% but patient has signs of breathing difficulty and accessory muscle use.  Diffuse wheezing throughout all lung fields.  Patient given DuoNeb and chest x-ray ordered.  Improving shortness of breath with DuoNeb.  Ordered another DuoNeb and  10 mg IM dexamethasone.  Chest x-ray does not show any evidence of pneumonia.  2nd  neb helpful.   Suspect viral bronchitis and asthma exacerbation.  Will treat with doxycycline since she says her sputum is productive of yellowish-green material and she has been sick for couple of months with recent worsening.  Start prednisone taper tomorrow.  Promethazine DM also sent.  Advised use of home inhalers.  Patient given neb machine. Sent albuterol neb solution. Reviewed very close monitoring and for any worsening of symptoms or fever to call 911 or go to the ER.   Final Clinical Impressions(s) / UC Diagnoses   Final diagnoses:  Acute bronchitis, unspecified organism  Asthma with acute exacerbation, unspecified asthma severity, unspecified whether persistent  Acute cough  Shortness of breath     Discharge Instructions      -Your x-ray does not show any pneumonia.  You have bronchitis and a flareup of your asthma. - I sent an antibiotic to the pharmacy as well as a steroid.  We gave you an injection with steroid in the clinic tonight to start the prednisone tomorrow.  I also sent Promethazine DM to pharmacy. - Use your inhaler or nebulizer as needed for shortness of breath.  Increase rest and fluids. - If your breathing is not improving with your inhalers and the steroids, call 911 or go to ER.     ED Prescriptions     Medication Sig Dispense Auth. Provider   doxycycline (VIBRAMYCIN) 100 MG capsule Take 1 capsule (100 mg total) by mouth 2 (two) times daily for 7 days. 14 capsule Danton Clap, PA-C   promethazine-dextromethorphan (PROMETHAZINE-DM) 6.25-15 MG/5ML syrup Take 5 mLs by mouth 4 (four) times daily as needed. 118 mL Laurene Footman B, PA-C   predniSONE (DELTASONE) 10 MG tablet Take 6 tabs on day 1 and decrease by 1 tablet daily until complete 21 tablet Laurene Footman B, PA-C   albuterol (PROVENTIL) (2.5 MG/3ML) 0.083% nebulizer solution Take 3 mLs (2.5 mg total) by nebulization  every 6 (six) hours as needed for wheezing or shortness of breath. 75 mL Danton Clap, PA-C      PDMP not reviewed this encounter.   Danton Clap, PA-C 12/19/22 1949

## 2022-12-19 NOTE — Discharge Instructions (Addendum)
-  Your x-ray does not show any pneumonia.  You have bronchitis and a flareup of your asthma. - I sent an antibiotic to the pharmacy as well as a steroid.  We gave you an injection with steroid in the clinic tonight to start the prednisone tomorrow.  I also sent Promethazine DM to pharmacy. - Use your inhaler or nebulizer as needed for shortness of breath.  Increase rest and fluids. - If your breathing is not improving with your inhalers and the steroids, call 911 or go to ER.

## 2022-12-19 NOTE — ED Triage Notes (Signed)
Pt presents with a cough and SOB x 2 days. She had a cold for several weeks.
